# Patient Record
Sex: Female | Born: 1940
Health system: Southern US, Community
[De-identification: ages and names within clinical notes are randomized; demographics above are authoritative.]

## PROBLEM LIST (undated history)

## (undated) DIAGNOSIS — M1990 Unspecified osteoarthritis, unspecified site: Secondary | ICD-10-CM

## (undated) DIAGNOSIS — I10 Essential (primary) hypertension: Secondary | ICD-10-CM

## (undated) HISTORY — PX: APPENDECTOMY: SHX54

## (undated) HISTORY — PX: ABDOMINAL HYSTERECTOMY: SHX81

## (undated) HISTORY — PX: TONSILLECTOMY: SUR1361

---

## 2019-03-31 DIAGNOSIS — M25552 Pain in left hip: Secondary | ICD-10-CM | POA: Diagnosis not present

## 2019-03-31 DIAGNOSIS — M1612 Unilateral primary osteoarthritis, left hip: Secondary | ICD-10-CM | POA: Diagnosis not present

## 2019-04-04 DIAGNOSIS — Z01118 Encounter for examination of ears and hearing with other abnormal findings: Secondary | ICD-10-CM | POA: Diagnosis not present

## 2019-04-04 DIAGNOSIS — Z1329 Encounter for screening for other suspected endocrine disorder: Secondary | ICD-10-CM | POA: Diagnosis not present

## 2019-04-04 DIAGNOSIS — Z87891 Personal history of nicotine dependence: Secondary | ICD-10-CM | POA: Diagnosis not present

## 2019-04-04 DIAGNOSIS — E1165 Type 2 diabetes mellitus with hyperglycemia: Secondary | ICD-10-CM | POA: Diagnosis not present

## 2019-04-04 DIAGNOSIS — I10 Essential (primary) hypertension: Secondary | ICD-10-CM | POA: Diagnosis not present

## 2019-04-04 DIAGNOSIS — Z01811 Encounter for preprocedural respiratory examination: Secondary | ICD-10-CM | POA: Diagnosis not present

## 2019-04-04 DIAGNOSIS — E559 Vitamin D deficiency, unspecified: Secondary | ICD-10-CM | POA: Diagnosis not present

## 2019-04-04 DIAGNOSIS — Z136 Encounter for screening for cardiovascular disorders: Secondary | ICD-10-CM | POA: Diagnosis not present

## 2019-04-04 DIAGNOSIS — Z0001 Encounter for general adult medical examination with abnormal findings: Secondary | ICD-10-CM | POA: Diagnosis not present

## 2019-04-04 DIAGNOSIS — E782 Mixed hyperlipidemia: Secondary | ICD-10-CM | POA: Diagnosis not present

## 2019-04-23 NOTE — Progress Notes (Signed)
Need surgery orders for 6-18 surgery pre op is 6-15

## 2019-04-28 DIAGNOSIS — Z1231 Encounter for screening mammogram for malignant neoplasm of breast: Secondary | ICD-10-CM | POA: Diagnosis not present

## 2019-04-30 NOTE — H&P (Signed)
TOTAL HIP ADMISSION H&P  Patient is admitted for left total hip arthroplasty, anterior approach.  Subjective:  Chief Complaint:   Left hip primary OA / pain  HPI: Danielle Tate, 78 y.o. female, has a history of pain and functional disability in the left hip(s) due to arthritis and patient has failed non-surgical conservative treatments for greater than 12 weeks to include NSAID's and/or analgesics, corticosteriod injections, use of assistive devices and activity modification.  Onset of symptoms was gradual starting < 1 year ago with rapidlly worsening course since that time.The patient noted no past surgery on the left hip(s).  Patient currently rates pain in the left hip at 10 out of 10 with activity. Patient has worsening of pain with activity and weight bearing, trendelenberg gait, pain that interfers with activities of daily living and pain with passive range of motion. Patient has evidence of periarticular osteophytes and joint space narrowing by imaging studies. This condition presents safety issues increasing the risk of falls. There is no current active infection.  Risks, benefits and expectations were discussed with the patient.  Risks including but not limited to the risk of anesthesia, blood clots, nerve damage, blood vessel damage, failure of the prosthesis, infection and up to and including death.  Patient understand the risks, benefits and expectations and wishes to proceed with surgery.   PCP: Patient, No Pcp Per  D/C Plans:       Home   Post-op Meds:       No Rx given   Tranexamic Acid:      To be given - IV   Decadron:      Is to be given  FYI:      ASA  Norco  DME:    Rx given for - RW &  3-n-1  PT:   HEP  Pharmacy: [email or web address]     Past Surgical History:  Procedure Laterality Date  . ABDOMINAL HYSTERECTOMY    . APPENDECTOMY    . TONSILLECTOMY      No current facility-administered medications for this encounter.    Current Outpatient Medications   Medication Sig Dispense Refill Last Dose  . amLODipine-benazepril (LOTREL) 5-20 MG capsule Take 1 capsule by mouth daily.     Marland Kitchen aspirin EC 81 MG tablet Take 81 mg by mouth daily.     Marland Kitchen atorvastatin (LIPITOR) 40 MG tablet Take 40 mg by mouth daily.     . cetirizine (ZYRTEC) 10 MG tablet Take 10 mg by mouth daily as needed for allergies.     . hydrochlorothiazide (HYDRODIURIL) 25 MG tablet Take 25 mg by mouth daily.     . meloxicam (MOBIC) 7.5 MG tablet Take 7.5 mg by mouth daily as needed for pain.     Marland Kitchen omeprazole (PRILOSEC) 40 MG capsule Take 40 mg by mouth daily.      No Known Allergies   Social History   Tobacco Use  . Smoking status: Former Smoker    Quit date: 2000    Years since quitting: 20.4  . Smokeless tobacco: Never Used  Substance Use Topics  . Alcohol use: Not Currently       Review of Systems  Constitutional: Negative.   HENT: Negative.   Eyes: Negative.   Respiratory: Negative.   Cardiovascular: Negative.   Gastrointestinal: Positive for heartburn.  Genitourinary: Positive for frequency.  Musculoskeletal: Positive for joint pain.  Skin: Negative.   Neurological: Negative.   Endo/Heme/Allergies: Positive for environmental allergies.  Psychiatric/Behavioral: Negative.  Objective:  Physical Exam  Constitutional: She is oriented to person, place, and time. She appears well-developed.  HENT:  Head: Normocephalic.  Eyes: Pupils are equal, round, and reactive to light.  Neck: Neck supple. No JVD present. No tracheal deviation present. No thyromegaly present.  Cardiovascular: Normal rate, regular rhythm and intact distal pulses.  Respiratory: Effort normal and breath sounds normal. No respiratory distress. She has no wheezes.  GI: Soft. There is no abdominal tenderness. There is no guarding.  Musculoskeletal:     Left hip: She exhibits decreased range of motion, decreased strength, tenderness and bony tenderness. She exhibits no swelling, no deformity and  no laceration.  Lymphadenopathy:    She has no cervical adenopathy.  Neurological: She is alert and oriented to person, place, and time.  Skin: Skin is warm and dry.  Psychiatric: She has a normal mood and affect.      Imaging Review Plain radiographs demonstrate severe degenerative joint disease of the left hip. The bone quality appears to be good for age and reported activity level.      Assessment/Plan:  End stage arthritis, left hip  The patient history, physical examination, clinical judgement of the provider and imaging studies are consistent with end stage degenerative joint disease of the left hip and total hip arthroplasty is deemed medically necessary. The treatment options including medical management, injection therapy, arthroscopy and arthroplasty were discussed at length. The risks and benefits of total hip arthroplasty were presented and reviewed. The risks due to aseptic loosening, infection, stiffness, dislocation/subluxation,  thromboembolic complications and other imponderables were discussed.  The patient acknowledged the explanation, agreed to proceed with the plan and consent was signed. Patient is being admitted for inpatient treatment for surgery, pain control, PT, OT, prophylactic antibiotics, VTE prophylaxis, progressive ambulation and ADL's and discharge planning.The patient is planning to be discharged home.     [place] Basim Bartnik   PA-C  05/07/2019, 8:46 PM

## 2019-05-02 ENCOUNTER — Other Ambulatory Visit (HOSPITAL_COMMUNITY): Payer: Self-pay | Admitting: *Deleted

## 2019-05-02 NOTE — Patient Instructions (Addendum)
Danielle Tate     Your procedure is scheduled on: 05-08-2019  Report to [facility] Main  Entrance  Report to admitting at [place] [facility] TEST ON_6-15-2020 at 245 pm, THIS TEST MUST BE DONE BEFORE SURGERY, COME TO [place]. ONCE YOUR COVID TEST IS COMPLETED, PLEASE BEGIN THE QUARANTINE INSTRUCTIONS AS OUTLINED IN YOUR HANDOUT.   Call this number if you have problems the morning of surgery [phone]    Remember: Do not eat food or drink liquids :After Midnight. BRUSH YOUR TEETH MORNING OF SURGERY AND RINSE YOUR MOUTH OUT, NO CHEWING GUM CANDY OR MINTS.   NO SOLID FOOD AFTER MIDNIGHT THE NIGHT PRIOR TO SURGERY. NOTHING BY MOUTH EXCEPT CLEAR LIQUIDS UNTIL. PLEASE FINISH ENSURE DRINK PER SURGEON ORDER 3 HOURS PRIOR TO SCHEDULED SURGERY TIME WHICH NEEDS TO BE COMPLETED AT 430 am.    CLEAR LIQUID DIET   Foods Allowed                                                                     Foods Excluded  Coffee and tea, regular and decaf                             liquids that you cannot  Plain Jell-O in any flavor                                             see through such as: Fruit ices (not with fruit pulp)                                     milk, soups, orange juice  Iced Popsicles                                    All solid food Carbonated beverages, regular and diet                                    Cranberry, grape and apple juices Sports drinks like Gatorade Lightly seasoned clear broth or consume(fat free) Sugar, honey syrup  Sample Menu Breakfast                                Lunch                                     Supper Cranberry juice                    Beef broth  Chicken broth Jell-O                                     Grape juice                           Apple juice Coffee or tea                        Jell-O                                       Popsicle                                                Coffee or tea                        Coffee or tea  _____________________________________________________________________     Take these medicines the morning of surgery with A SIP OF WATER: atorvastatin (lipitor), omeprazole (prilosec)                               You may not have any metal on your body including hair pins and              piercings  Do not wear jewelry, make-up, lotions, powders or perfumes, deodorant             Do not wear nail polish.  Do not shave  48 hours prior to surgery.                Do not bring valuables to the hospital. [place].  Contacts, dentures or bridgework may not be worn into surgery.      : _____________________________________________________________________             [facility] - Preparing for Surgery Before surgery, you can play an important role.  Because skin is not sterile, your skin needs to be as free of germs as possible.  You can reduce the number of germs on your skin by washing with CHG (chlorahexidine gluconate) soap before surgery.  CHG is an antiseptic cleaner which kills germs and bonds with the skin to continue killing germs even after washing. Please DO NOT use if you have an allergy to CHG or antibacterial soaps.  If your skin becomes reddened/irritated stop using the CHG and inform your nurse when you arrive at Short Stay. Do not shave (including legs and underarms) for at least 48 hours prior to the first CHG shower.  You may shave your face/neck. Please follow these instructions carefully:  1.  Shower with CHG Soap the night before surgery and the  morning of Surgery.  2.  If you choose to wash your hair, wash your hair first as usual with your  normal  shampoo.  3.  After you shampoo, rinse your hair and body thoroughly to remove the  shampoo.  4.  Use CHG as you would any  other liquid soap.  You can apply chg directly  to the skin and wash                       Gently with a scrungie or clean washcloth.  5.  Apply the CHG Soap to your body ONLY FROM THE NECK DOWN.   Do not use on face/ open                           Wound or open sores. Avoid contact with eyes, ears mouth and genitals (private parts).                       Wash face,  Genitals (private parts) with your normal soap.             6.  Wash thoroughly, paying special attention to the area where your surgery  will be performed.  7.  Thoroughly rinse your body with warm water from the neck down.  8.  DO NOT shower/wash with your normal soap after using and rinsing off  the CHG Soap.                9.  Pat yourself dry with a clean towel.            10.  Wear clean pajamas.            11.  Place clean sheets on your bed the night of your first shower and do not  sleep with pets. Day of Surgery : Do not apply any lotions/deodorants the morning of surgery.  Please wear clean clothes to the hospital/surgery center.  FAILURE TO FOLLOW THESE INSTRUCTIONS MAY RESULT IN THE CANCELLATION OF YOUR SURGERY PATIENT SIGNATURE_________________________________  NURSE SIGNATURE__________________________________  ________________________________________________________________________   Danielle Tate  An incentive spirometer is a tool that can help keep your lungs clear and active. This tool measures how well you are filling your lungs with each breath. Taking long deep breaths may help reverse or decrease the chance of developing breathing (pulmonary) problems (especially infection) following:  A long period of time when you are unable to move or be active. BEFORE THE PROCEDURE   If the spirometer includes an indicator to show your best effort, your nurse or respiratory therapist will set it to a desired goal.  If possible, sit up straight or lean slightly forward. Try not to slouch.  Hold the  incentive spirometer in an upright position. INSTRUCTIONS FOR USE  1. Sit on the edge of your bed if possible, or sit up as far as you can in bed or on a chair. 2. Hold the incentive spirometer in an upright position. 3. Breathe out normally. 4. Place the mouthpiece in your mouth and seal your lips tightly around it. 5. Breathe in slowly and as deeply as possible, raising the piston or the ball toward the top of the column. 6. Hold your breath for 3-5 seconds or for as long as possible. Allow the piston or ball to fall to the bottom of the column. 7. Remove the mouthpiece from your mouth and breathe out normally. 8. Rest for a few seconds and repeat Steps 1 through 7 at least 10 times every 1-2 hours when you are awake. Take your time and take a few normal breaths between deep breaths. 9. The spirometer may include an indicator to  show your best effort. Use the indicator as a goal to work toward during each repetition. 10. After each set of 10 deep breaths, practice coughing to be sure your lungs are clear. If you have an incision (the cut made at the time of surgery), support your incision when coughing by placing a pillow or rolled up towels firmly against it. Once you are able to get out of bed, walk around indoors and cough well. You may stop using the incentive spirometer when instructed by your caregiver.  RISKS AND COMPLICATIONS  Take your time so you do not get dizzy or light-headed.  If you are in pain, you may need to take or ask for pain medication before doing incentive spirometry. It is harder to take a deep breath if you are having pain. AFTER USE  Rest and breathe slowly and easily.  It can be helpful to keep track of a log of your progress. Your caregiver can provide you with a simple table to help with this. If you are using the spirometer at home, follow these instructions: [place] IF:   You are having difficultly using the spirometer.  You have trouble using  the spirometer as often as instructed.  Your pain medication is not giving enough relief while using the spirometer.  You develop fever of 100.5 F (38.1 C) or higher. SEEK IMMEDIATE MEDICAL CARE IF:   You cough up bloody sputum that had not been present before.  You develop fever of 102 F (38.9 C) or greater.  You develop worsening pain at or near the incision site. MAKE SURE YOU:   Understand these instructions.  Will watch your condition.  Will get help right away if you are not doing well or get worse. Document Released: 03/19/2007 Document Revised: 01/29/2012 Document Reviewed: 05/20/2007 Exit[employer] Patient Information 2014 Exit[employer], [place].   ________________________________________________________________________  WHAT IS A BLOOD TRANSFUSION? Blood Transfusion Information  A transfusion is the replacement of blood or some of its parts. Blood is made up of multiple cells which provide different functions.  Red blood cells carry oxygen and are used for blood loss replacement.  White blood cells fight against infection.  Platelets control bleeding.  Plasma helps clot blood.  Other blood products are available for specialized needs, such as hemophilia or other clotting disorders. BEFORE THE TRANSFUSION  Who gives blood for transfusions?   Healthy volunteers who are fully evaluated to make sure their blood is safe. This is blood bank blood. Transfusion therapy is the safest it has ever been in the practice of medicine. Before blood is taken from a donor, a complete history is taken to make sure that person has no history of diseases nor engages in risky social behavior (examples are intravenous drug use or sexual activity with multiple partners). The donor's travel history is screened to minimize risk of transmitting infections, such as malaria. The donated blood is tested for signs of infectious diseases, such as HIV and hepatitis. The blood is then tested to be sure it  is compatible with you in order to minimize the chance of a transfusion reaction. If you or a relative donates blood, this is often done in anticipation of surgery and is not appropriate for emergency situations. It takes many days to process the donated blood. RISKS AND COMPLICATIONS Although transfusion therapy is very safe and saves many lives, the main dangers of transfusion include:   Getting an infectious disease.  Developing a transfusion reaction. This is an allergic reaction  to something in the blood you were given. Every precaution is taken to prevent this. The decision to have a blood transfusion has been considered carefully by your caregiver before blood is given. Blood is not given unless the benefits outweigh the risks. AFTER THE TRANSFUSION  Right after receiving a blood transfusion, you will usually feel much better and more energetic. This is especially true if your red blood cells have gotten low (anemic). The transfusion raises the level of the red blood cells which carry oxygen, and this usually causes an energy increase.  The nurse administering the transfusion will monitor you carefully for complications. HOME CARE INSTRUCTIONS  No special instructions are needed after a transfusion. You may find your energy is better. Speak with your caregiver about any limitations on activity for underlying diseases you may have. SEEK MEDICAL CARE IF:   Your condition is not improving after your transfusion.  You develop redness or irritation at the intravenous (IV) site. SEEK IMMEDIATE MEDICAL CARE IF:  Any of the following symptoms occur over the next 12 hours:  Shaking chills.  You have a temperature by mouth above 102 F (38.9 C), not controlled by medicine.  Chest, back, or muscle pain.  People around you feel you are not acting correctly or are confused.  Shortness of breath or difficulty breathing.  Dizziness and fainting.  You get a rash or develop hives.  You  have a decrease in urine output.  Your urine turns a dark color or changes to pink, red, or brown. Any of the following symptoms occur over the next 10 days:  You have a temperature by mouth above 102 F (38.9 C), not controlled by medicine.  Shortness of breath.  Weakness after normal activity.  The white part of the eye turns yellow (jaundice).  You have a decrease in the amount of urine or are urinating less often.  Your urine turns a dark color or changes to pink, red, or brown. Document Released: 11/03/2000 Document Revised: 01/29/2012 Document Reviewed: 06/22/2008 [facility] Patient Information 2014 [place], [place].  _______________________________________________________________________

## 2019-05-05 ENCOUNTER — Other Ambulatory Visit: Payer: Self-pay

## 2019-05-05 ENCOUNTER — Other Ambulatory Visit (HOSPITAL_COMMUNITY)
Admission: RE | Admit: 2019-05-05 | Discharge: 2019-05-05 | Disposition: A | Discharge status: Home / Self Care | Payer: Medicare HMO | Source: Ambulatory Visit | Attending: Orthopedic Surgery | Admitting: Orthopedic Surgery

## 2019-05-05 ENCOUNTER — Encounter (HOSPITAL_COMMUNITY): Payer: Self-pay

## 2019-05-05 ENCOUNTER — Encounter (HOSPITAL_COMMUNITY)
Admission: RE | Admit: 2019-05-05 | Discharge: 2019-05-05 | Disposition: A | Discharge status: Home / Self Care | Payer: Medicare HMO | Source: Ambulatory Visit | Attending: Orthopedic Surgery | Admitting: Orthopedic Surgery

## 2019-05-05 DIAGNOSIS — Z6831 Body mass index (BMI) 31.0-31.9, adult: Secondary | ICD-10-CM | POA: Diagnosis not present

## 2019-05-05 DIAGNOSIS — I10 Essential (primary) hypertension: Secondary | ICD-10-CM | POA: Insufficient documentation

## 2019-05-05 DIAGNOSIS — Z90710 Acquired absence of both cervix and uterus: Secondary | ICD-10-CM | POA: Diagnosis not present

## 2019-05-05 DIAGNOSIS — Z87891 Personal history of nicotine dependence: Secondary | ICD-10-CM | POA: Diagnosis not present

## 2019-05-05 DIAGNOSIS — Z791 Long term (current) use of non-steroidal anti-inflammatories (NSAID): Secondary | ICD-10-CM | POA: Diagnosis not present

## 2019-05-05 DIAGNOSIS — E669 Obesity, unspecified: Secondary | ICD-10-CM | POA: Diagnosis not present

## 2019-05-05 DIAGNOSIS — Z880 Allergy status to penicillin: Secondary | ICD-10-CM | POA: Diagnosis not present

## 2019-05-05 DIAGNOSIS — Z7982 Long term (current) use of aspirin: Secondary | ICD-10-CM | POA: Diagnosis not present

## 2019-05-05 DIAGNOSIS — Z79899 Other long term (current) drug therapy: Secondary | ICD-10-CM | POA: Diagnosis not present

## 2019-05-05 DIAGNOSIS — Z01818 Encounter for other preprocedural examination: Secondary | ICD-10-CM | POA: Insufficient documentation

## 2019-05-05 DIAGNOSIS — M1612 Unilateral primary osteoarthritis, left hip: Secondary | ICD-10-CM | POA: Insufficient documentation

## 2019-05-05 DIAGNOSIS — K219 Gastro-esophageal reflux disease without esophagitis: Secondary | ICD-10-CM | POA: Diagnosis not present

## 2019-05-05 DIAGNOSIS — Z1159 Encounter for screening for other viral diseases: Secondary | ICD-10-CM | POA: Insufficient documentation

## 2019-05-05 LAB — CBC
HCT: 39.6 % (ref 36.0–46.0)
Hemoglobin: 12.1 g/dL (ref 12.0–15.0)
MCH: 26.4 pg (ref 26.0–34.0)
MCHC: 30.6 g/dL (ref 30.0–36.0)
MCV: 86.3 fL (ref 80.0–100.0)
Platelets: 239 10*3/uL (ref 150–400)
RBC: 4.59 MIL/uL (ref 3.87–5.11)
RDW: 14.6 % (ref 11.5–15.5)
WBC: 7.8 10*3/uL (ref 4.0–10.5)
nRBC: 0 % (ref 0.0–0.2)

## 2019-05-05 LAB — BASIC METABOLIC PANEL
Anion gap: 8 (ref 5–15)
BUN: 23 mg/dL (ref 8–23)
CO2: 28 mmol/L (ref 22–32)
Calcium: 9 mg/dL (ref 8.9–10.3)
Chloride: 101 mmol/L (ref 98–111)
Creatinine, Ser: 0.85 mg/dL (ref 0.44–1.00)
GFR calc Af Amer: >60 mL/min (ref >60)
GFR calc non Af Amer: >60 mL/min (ref >60)
Glucose, Bld: 87 mg/dL (ref 70–99)
Potassium: 3 mmol/L — ABNORMAL LOW (ref 3.5–5.1)
Sodium: 137 mmol/L (ref 135–145)

## 2019-05-05 LAB — SURGICAL PCR SCREEN
MRSA, PCR: NEGATIVE
Staphylococcus aureus: POSITIVE — AB

## 2019-05-05 NOTE — Progress Notes (Signed)
Konrad Felix PA  Pt stopped taking her ASA and her Mobic 05/01/19 per Dr. Aurea Graff office EKG and Labs are in [place]

## 2019-05-06 LAB — ABO/RH: ABO/RH(D): AB POS

## 2019-05-07 LAB — NOVEL CORONAVIRUS, NAA (HOSP ORDER, SEND-OUT TO REF LAB; TAT 18-24 HRS): SARS-CoV-2, NAA: NOT DETECTED

## 2019-05-07 NOTE — Progress Notes (Signed)
SPOKE W/  patient     SCREENING SYMPTOMS OF COVID 19:   COUGH--NO  RUNNY NOSE--- NO  SORE THROAT---NO  NASAL CONGESTION----NO  SNEEZING----NO  SHORTNESS OF BREATH---NO  DIFFICULTY BREATHING---NO  TEMP >100.0 -----NO  UNEXPLAINED BODY ACHES------NO  CHILLS -------- NO  HEADACHES ---------NO  LOSS OF SMELL/ TASTE --------NO    HAVE YOU OR ANY FAMILY MEMBER TRAVELLED PAST 14 DAYS OUT OF THE   COUNTY---NO STATE----NO COUNTRY----NO  HAVE YOU OR ANY FAMILY MEMBER BEEN EXPOSED TO ANYONE WITH COVID 19? NO

## 2019-05-08 ENCOUNTER — Observation Stay (HOSPITAL_COMMUNITY)
Admission: RE | Admit: 2019-05-08 | Discharge: 2019-05-09 | Disposition: A | Discharge status: Home / Self Care | Payer: Medicare HMO | Attending: Orthopedic Surgery | Admitting: Orthopedic Surgery

## 2019-05-08 ENCOUNTER — Ambulatory Visit (HOSPITAL_COMMUNITY): Payer: Medicare HMO

## 2019-05-08 ENCOUNTER — Encounter (HOSPITAL_COMMUNITY)
Admission: RE | Disposition: A | Discharge status: Home / Self Care | Payer: Self-pay | Source: Home / Self Care | Attending: Orthopedic Surgery

## 2019-05-08 ENCOUNTER — Encounter (HOSPITAL_COMMUNITY): Payer: Self-pay | Admitting: Emergency Medicine

## 2019-05-08 ENCOUNTER — Other Ambulatory Visit: Payer: Self-pay

## 2019-05-08 ENCOUNTER — Ambulatory Visit (HOSPITAL_COMMUNITY): Payer: Medicare HMO | Admitting: Physician Assistant

## 2019-05-08 ENCOUNTER — Ambulatory Visit (HOSPITAL_COMMUNITY): Payer: Medicare HMO | Admitting: Anesthesiology

## 2019-05-08 DIAGNOSIS — M25559 Pain in unspecified hip: Secondary | ICD-10-CM

## 2019-05-08 DIAGNOSIS — Z471 Aftercare following joint replacement surgery: Secondary | ICD-10-CM | POA: Diagnosis not present

## 2019-05-08 DIAGNOSIS — Z87891 Personal history of nicotine dependence: Secondary | ICD-10-CM | POA: Insufficient documentation

## 2019-05-08 DIAGNOSIS — E669 Obesity, unspecified: Secondary | ICD-10-CM | POA: Insufficient documentation

## 2019-05-08 DIAGNOSIS — Z96642 Presence of left artificial hip joint: Secondary | ICD-10-CM | POA: Diagnosis not present

## 2019-05-08 DIAGNOSIS — Z90710 Acquired absence of both cervix and uterus: Secondary | ICD-10-CM | POA: Insufficient documentation

## 2019-05-08 DIAGNOSIS — Z96649 Presence of unspecified artificial hip joint: Secondary | ICD-10-CM

## 2019-05-08 DIAGNOSIS — Z79899 Other long term (current) drug therapy: Secondary | ICD-10-CM | POA: Diagnosis not present

## 2019-05-08 DIAGNOSIS — Z880 Allergy status to penicillin: Secondary | ICD-10-CM | POA: Insufficient documentation

## 2019-05-08 DIAGNOSIS — Z1159 Encounter for screening for other viral diseases: Secondary | ICD-10-CM | POA: Insufficient documentation

## 2019-05-08 DIAGNOSIS — Z7982 Long term (current) use of aspirin: Secondary | ICD-10-CM | POA: Insufficient documentation

## 2019-05-08 DIAGNOSIS — Z791 Long term (current) use of non-steroidal anti-inflammatories (NSAID): Secondary | ICD-10-CM | POA: Diagnosis not present

## 2019-05-08 DIAGNOSIS — Z6831 Body mass index (BMI) 31.0-31.9, adult: Secondary | ICD-10-CM | POA: Insufficient documentation

## 2019-05-08 DIAGNOSIS — K219 Gastro-esophageal reflux disease without esophagitis: Secondary | ICD-10-CM | POA: Insufficient documentation

## 2019-05-08 DIAGNOSIS — M1612 Unilateral primary osteoarthritis, left hip: Secondary | ICD-10-CM | POA: Diagnosis not present

## 2019-05-08 DIAGNOSIS — I10 Essential (primary) hypertension: Secondary | ICD-10-CM | POA: Diagnosis not present

## 2019-05-08 HISTORY — PX: TOTAL HIP ARTHROPLASTY: SHX124

## 2019-05-08 HISTORY — DX: Essential (primary) hypertension: I10

## 2019-05-08 LAB — TYPE AND SCREEN
ABO/RH(D): AB POS
Antibody Screen: NEGATIVE

## 2019-05-08 LAB — BASIC METABOLIC PANEL
Anion gap: 13 (ref 5–15)
BUN: 27 mg/dL — ABNORMAL HIGH (ref 8–23)
CO2: 26 mmol/L (ref 22–32)
Calcium: 9.4 mg/dL (ref 8.9–10.3)
Chloride: 105 mmol/L (ref 98–111)
Creatinine, Ser: 0.82 mg/dL (ref 0.44–1.00)
GFR calc Af Amer: >60 mL/min (ref >60)
GFR calc non Af Amer: >60 mL/min (ref >60)
Glucose, Bld: 96 mg/dL (ref 70–99)
Potassium: 3.2 mmol/L — ABNORMAL LOW (ref 3.5–5.1)
Sodium: 144 mmol/L (ref 135–145)

## 2019-05-08 SURGERY — ARTHROPLASTY, HIP, TOTAL, ANTERIOR APPROACH
Anesthesia: Spinal | Site: Hip | Laterality: Left

## 2019-05-08 MED ORDER — POLYETHYLENE GLYCOL 3350 17 G PO PACK
17.0000 g | PACK | Freq: Two times a day (BID) | ORAL | Status: DC
  Administered 2019-05-08 – 2019-05-09 (×2): 17 g via ORAL
  Filled 2019-05-08 (×2): qty 1

## 2019-05-08 MED ORDER — METOCLOPRAMIDE HCL 5 MG PO TABS: 5.0000 mg | ORAL_TABLET | Freq: Three times a day (TID) | ORAL | Status: DC | PRN

## 2019-05-08 MED ORDER — AMLODIPINE BESYLATE 5 MG PO TABS
5.0000 mg | ORAL_TABLET | Freq: Once | ORAL | Status: AC
  Administered 2019-05-08: 5 mg via ORAL
  Filled 2019-05-08 (×3): qty 1

## 2019-05-08 MED ORDER — MAGNESIUM CITRATE PO SOLN: 1.0000 | Freq: Once | ORAL | Status: DC | PRN

## 2019-05-08 MED ORDER — FENTANYL CITRATE (PF) 100 MCG/2ML IJ SOLN: 25.0000 ug | INTRAMUSCULAR | Status: DC | PRN

## 2019-05-08 MED ORDER — DEXAMETHASONE SODIUM PHOSPHATE 10 MG/ML IJ SOLN
10.0000 mg | Freq: Once | INTRAMUSCULAR | Status: AC
  Administered 2019-05-08: 10 mg via INTRAVENOUS

## 2019-05-08 MED ORDER — HYDROCHLOROTHIAZIDE 25 MG PO TABS: 25.0000 mg | ORAL_TABLET | Freq: Every day | ORAL | Status: DC

## 2019-05-08 MED ORDER — ONDANSETRON HCL 4 MG PO TABS: 4.0000 mg | ORAL_TABLET | Freq: Four times a day (QID) | ORAL | Status: DC | PRN

## 2019-05-08 MED ORDER — CHLORHEXIDINE GLUCONATE 4 % EX LIQD: 60.0000 mL | Freq: Once | CUTANEOUS | Status: DC

## 2019-05-08 MED ORDER — DIPHENHYDRAMINE HCL 12.5 MG/5ML PO ELIX: 12.5000 mg | ORAL_SOLUTION | ORAL | Status: DC | PRN

## 2019-05-08 MED ORDER — PROPOFOL 500 MG/50ML IV EMUL
INTRAVENOUS | Status: DC | PRN
  Administered 2019-05-08: 50 ug/kg/min via INTRAVENOUS

## 2019-05-08 MED ORDER — ALUM & MAG HYDROXIDE-SIMETH 200-200-20 MG/5ML PO SUSP: 15.0000 mL | ORAL | Status: DC | PRN

## 2019-05-08 MED ORDER — MORPHINE SULFATE (PF) 2 MG/ML IV SOLN: 0.5000 mg | INTRAVENOUS | Status: DC | PRN

## 2019-05-08 MED ORDER — CEFAZOLIN SODIUM-DEXTROSE 2-4 GM/100ML-% IV SOLN
2.0000 g | Freq: Four times a day (QID) | INTRAVENOUS | Status: AC
  Administered 2019-05-08 (×2): 2 g via INTRAVENOUS
  Filled 2019-05-08 (×2): qty 100

## 2019-05-08 MED ORDER — DEXAMETHASONE SODIUM PHOSPHATE 10 MG/ML IJ SOLN
INTRAMUSCULAR | Status: AC
  Filled 2019-05-08: qty 1

## 2019-05-08 MED ORDER — PANTOPRAZOLE SODIUM 40 MG PO TBEC
80.0000 mg | DELAYED_RELEASE_TABLET | Freq: Every day | ORAL | Status: DC
  Administered 2019-05-08 – 2019-05-09 (×2): 80 mg via ORAL
  Filled 2019-05-08 (×3): qty 2

## 2019-05-08 MED ORDER — ACETAMINOPHEN 325 MG PO TABS: 325.0000 mg | ORAL_TABLET | Freq: Four times a day (QID) | ORAL | Status: DC | PRN

## 2019-05-08 MED ORDER — METOCLOPRAMIDE HCL 5 MG/ML IJ SOLN: 5.0000 mg | Freq: Three times a day (TID) | INTRAMUSCULAR | Status: DC | PRN

## 2019-05-08 MED ORDER — MENTHOL 3 MG MT LOZG: 1.0000 | LOZENGE | OROMUCOSAL | Status: DC | PRN

## 2019-05-08 MED ORDER — ONDANSETRON HCL 4 MG/2ML IJ SOLN
INTRAMUSCULAR | Status: AC
  Filled 2019-05-08: qty 2

## 2019-05-08 MED ORDER — METHOCARBAMOL 500 MG PO TABS
500.0000 mg | ORAL_TABLET | Freq: Four times a day (QID) | ORAL | Status: DC | PRN
  Administered 2019-05-08: 500 mg via ORAL
  Filled 2019-05-08: qty 1

## 2019-05-08 MED ORDER — ONDANSETRON HCL 4 MG/2ML IJ SOLN: 4.0000 mg | Freq: Once | INTRAMUSCULAR | Status: DC | PRN

## 2019-05-08 MED ORDER — PROPOFOL 10 MG/ML IV BOLUS
INTRAVENOUS | Status: AC
  Filled 2019-05-08: qty 40

## 2019-05-08 MED ORDER — PHENOL 1.4 % MT LIQD: 1.0000 | OROMUCOSAL | Status: DC | PRN

## 2019-05-08 MED ORDER — PROPOFOL 500 MG/50ML IV EMUL
INTRAVENOUS | Status: DC | PRN
  Administered 2019-05-08: 20 mg via INTRAVENOUS
  Administered 2019-05-08 (×2): 10 mg via INTRAVENOUS

## 2019-05-08 MED ORDER — FERROUS SULFATE 325 (65 FE) MG PO TABS
325.0000 mg | ORAL_TABLET | Freq: Three times a day (TID) | ORAL | Status: DC
  Administered 2019-05-09 (×2): 325 mg via ORAL
  Filled 2019-05-08 (×2): qty 1

## 2019-05-08 MED ORDER — 0.9 % SODIUM CHLORIDE (POUR BTL) OPTIME
TOPICAL | Status: DC | PRN
  Administered 2019-05-08: 12:00:00 1000 mL

## 2019-05-08 MED ORDER — ATORVASTATIN CALCIUM 40 MG PO TABS
40.0000 mg | ORAL_TABLET | Freq: Every day | ORAL | Status: DC
  Administered 2019-05-08: 40 mg via ORAL
  Filled 2019-05-08: qty 1

## 2019-05-08 MED ORDER — BISACODYL 10 MG RE SUPP: 10.0000 mg | Freq: Every day | RECTAL | Status: DC | PRN

## 2019-05-08 MED ORDER — STERILE WATER FOR IRRIGATION IR SOLN
Status: DC | PRN
  Administered 2019-05-08: 2000 mL

## 2019-05-08 MED ORDER — LACTATED RINGERS IV SOLN
INTRAVENOUS | Status: DC
  Administered 2019-05-08 (×2): via INTRAVENOUS

## 2019-05-08 MED ORDER — CEFAZOLIN SODIUM-DEXTROSE 2-4 GM/100ML-% IV SOLN
2.0000 g | INTRAVENOUS | Status: AC
  Administered 2019-05-08: 11:00:00 2 g via INTRAVENOUS
  Filled 2019-05-08: qty 100

## 2019-05-08 MED ORDER — ASPIRIN 81 MG PO CHEW
81.0000 mg | CHEWABLE_TABLET | Freq: Two times a day (BID) | ORAL | Status: DC
  Administered 2019-05-08 – 2019-05-09 (×2): 81 mg via ORAL
  Filled 2019-05-08 (×2): qty 1

## 2019-05-08 MED ORDER — ONDANSETRON HCL 4 MG/2ML IJ SOLN: 4.0000 mg | Freq: Four times a day (QID) | INTRAMUSCULAR | Status: DC | PRN

## 2019-05-08 MED ORDER — SODIUM CHLORIDE 0.9 % IV SOLN
INTRAVENOUS | Status: DC | PRN
  Administered 2019-05-08: 11:00:00 25 ug/min via INTRAVENOUS

## 2019-05-08 MED ORDER — METHOCARBAMOL 500 MG IVPB - SIMPLE MED
500.0000 mg | Freq: Four times a day (QID) | INTRAVENOUS | Status: DC | PRN
  Filled 2019-05-08: qty 50

## 2019-05-08 MED ORDER — HYDROCHLOROTHIAZIDE 25 MG PO TABS
25.0000 mg | ORAL_TABLET | Freq: Every day | ORAL | Status: DC
  Administered 2019-05-08 – 2019-05-09 (×2): 25 mg via ORAL
  Filled 2019-05-08 (×2): qty 1

## 2019-05-08 MED ORDER — OXYCODONE HCL 5 MG/5ML PO SOLN: 5.0000 mg | Freq: Once | ORAL | Status: DC | PRN

## 2019-05-08 MED ORDER — SODIUM CHLORIDE 0.9 % IV SOLN
INTRAVENOUS | Status: DC
  Administered 2019-05-08 – 2019-05-09 (×2): via INTRAVENOUS

## 2019-05-08 MED ORDER — ONDANSETRON HCL 4 MG/2ML IJ SOLN
INTRAMUSCULAR | Status: DC | PRN
  Administered 2019-05-08: 4 mg via INTRAVENOUS

## 2019-05-08 MED ORDER — LORATADINE 10 MG PO TABS
10.0000 mg | ORAL_TABLET | Freq: Every day | ORAL | Status: DC
  Administered 2019-05-08 – 2019-05-09 (×2): 10 mg via ORAL
  Filled 2019-05-08 (×2): qty 1

## 2019-05-08 MED ORDER — BUPIVACAINE IN DEXTROSE 0.75-8.25 % IT SOLN
INTRATHECAL | Status: DC | PRN
  Administered 2019-05-08: 1.4 mL via INTRATHECAL

## 2019-05-08 MED ORDER — OXYCODONE HCL 5 MG PO TABS: 5.0000 mg | ORAL_TABLET | Freq: Once | ORAL | Status: DC | PRN

## 2019-05-08 MED ORDER — PHENYLEPHRINE HCL (PRESSORS) 10 MG/ML IV SOLN
INTRAVENOUS | Status: AC
  Filled 2019-05-08: qty 1

## 2019-05-08 MED ORDER — HYDROCODONE-ACETAMINOPHEN 5-325 MG PO TABS
1.0000 | ORAL_TABLET | ORAL | Status: DC | PRN
  Administered 2019-05-08 – 2019-05-09 (×4): 1 via ORAL
  Filled 2019-05-08 (×4): qty 1

## 2019-05-08 MED ORDER — HYDROCODONE-ACETAMINOPHEN 7.5-325 MG PO TABS: 1.0000 | ORAL_TABLET | ORAL | Status: DC | PRN

## 2019-05-08 MED ORDER — DOCUSATE SODIUM 100 MG PO CAPS
100.0000 mg | ORAL_CAPSULE | Freq: Two times a day (BID) | ORAL | Status: DC
  Administered 2019-05-08 – 2019-05-09 (×2): 100 mg via ORAL
  Filled 2019-05-08 (×2): qty 1

## 2019-05-08 MED ORDER — TRANEXAMIC ACID-NACL 1000-0.7 MG/100ML-% IV SOLN
1000.0000 mg | INTRAVENOUS | Status: AC
  Administered 2019-05-08: 1000 mg via INTRAVENOUS
  Filled 2019-05-08: qty 100

## 2019-05-08 MED ORDER — DEXAMETHASONE SODIUM PHOSPHATE 10 MG/ML IJ SOLN
10.0000 mg | Freq: Once | INTRAMUSCULAR | Status: AC
  Administered 2019-05-09: 10 mg via INTRAVENOUS
  Filled 2019-05-08: qty 1

## 2019-05-08 MED ORDER — TRANEXAMIC ACID-NACL 1000-0.7 MG/100ML-% IV SOLN
1000.0000 mg | Freq: Once | INTRAVENOUS | Status: AC
  Administered 2019-05-08: 1000 mg via INTRAVENOUS
  Filled 2019-05-08: qty 100

## 2019-05-08 SURGICAL SUPPLY — 46 items
BAG DECANTER FOR FLEXI CONT (MISCELLANEOUS) IMPLANT
BAG ZIPLOCK 12X15 (MISCELLANEOUS) IMPLANT
BLADE SAG 18X100X1.27 (BLADE) ×2 IMPLANT
BLADE SURG SZ10 CARB STEEL (BLADE) ×4 IMPLANT
COVER PERINEAL POST (MISCELLANEOUS) ×2 IMPLANT
COVER SURGICAL LIGHT HANDLE (MISCELLANEOUS) ×2 IMPLANT
COVER WAND RF STERILE (DRAPES) IMPLANT
CUP ACET PINNACLE SECTR 50MM (Hips) ×1 IMPLANT
DERMABOND ADVANCED (GAUZE/BANDAGES/DRESSINGS) ×1
DERMABOND ADVANCED .7 DNX12 (GAUZE/BANDAGES/DRESSINGS) ×1 IMPLANT
DRAPE STERI IOBAN 125X83 (DRAPES) ×2 IMPLANT
DRAPE U-SHAPE 47X51 STRL (DRAPES) ×4 IMPLANT
DRESSING AQUACEL AG SP 3.5X10 (GAUZE/BANDAGES/DRESSINGS) ×1 IMPLANT
DRSG AQUACEL AG ADV 3.5X10 (GAUZE/BANDAGES/DRESSINGS) ×2 IMPLANT
DRSG AQUACEL AG SP 3.5X10 (GAUZE/BANDAGES/DRESSINGS) ×2
DURAPREP 26ML APPLICATOR (WOUND CARE) ×2 IMPLANT
ELECT BLADE TIP CTD 4 INCH (ELECTRODE) ×2 IMPLANT
ELECT REM PT RETURN 15FT ADLT (MISCELLANEOUS) ×2 IMPLANT
ELIMINATOR HOLE APEX DEPUY (Hips) ×2 IMPLANT
GLOVE BIO SURGEON STRL SZ 6 (GLOVE) ×4 IMPLANT
GLOVE BIOGEL PI IND STRL 6.5 (GLOVE) ×1 IMPLANT
GLOVE BIOGEL PI IND STRL 8.5 (GLOVE) ×1 IMPLANT
GLOVE BIOGEL PI INDICATOR 6.5 (GLOVE) ×1
GLOVE BIOGEL PI INDICATOR 8.5 (GLOVE) ×1
GLOVE ECLIPSE 8.0 STRL XLNG CF (GLOVE) ×4 IMPLANT
GLOVE ORTHO TXT STRL SZ7.5 (GLOVE) ×4 IMPLANT
GOWN STRL REUS W/TWL LRG LVL3 (GOWN DISPOSABLE) ×4 IMPLANT
GOWN STRL REUS W/TWL XL LVL3 (GOWN DISPOSABLE) ×2 IMPLANT
HEAD FEM STD 32X+1 STRL (Hips) ×2 IMPLANT
HOLDER FOLEY CATH W/STRAP (MISCELLANEOUS) ×2 IMPLANT
KIT TURNOVER KIT A (KITS) IMPLANT
LINER ACET PNNCL PLUS4 NEUTRAL (Hips) ×1 IMPLANT
PACK ANTERIOR HIP CUSTOM (KITS) ×2 IMPLANT
PINNACLE PLUS 4 NEUTRAL (Hips) ×2 IMPLANT
PINNACLE SECTOR CUP 50MM (Hips) ×2 IMPLANT
SCREW 6.5MMX30MM (Screw) ×2 IMPLANT
STEM FEM ACTIS HIGH SZ2 (Stem) ×2 IMPLANT
SUT MNCRL AB 4-0 PS2 18 (SUTURE) ×2 IMPLANT
SUT STRATAFIX 0 PDS 27 VIOLET (SUTURE) ×2
SUT VIC AB 1 CT1 36 (SUTURE) ×6 IMPLANT
SUT VIC AB 2-0 CT1 27 (SUTURE) ×2
SUT VIC AB 2-0 CT1 TAPERPNT 27 (SUTURE) ×2 IMPLANT
SUTURE STRATFX 0 PDS 27 VIOLET (SUTURE) ×1 IMPLANT
TRAY FOLEY MTR SLVR 16FR STAT (SET/KITS/TRAYS/PACK) IMPLANT
WATER STERILE IRR 1000ML POUR (IV SOLUTION) ×2 IMPLANT
YANKAUER SUCT BULB TIP 10FT TU (MISCELLANEOUS) IMPLANT

## 2019-05-08 NOTE — Discharge Instructions (Signed)
INSTRUCTIONS AFTER JOINT REPLACEMENT  ° °o Remove items at home which could result in a fall. This includes throw rugs or furniture in walking pathways °o ICE to the affected joint every three hours while awake for 30 minutes at a time, for at least the first 3-5 days, and then as needed for pain and swelling.  Continue to use ice for pain and swelling. You may notice swelling that will progress down to the foot and ankle.  This is normal after surgery.  Elevate your leg when you are not up walking on it.   °o Continue to use the breathing machine you got in the hospital (incentive spirometer) which will help keep your temperature down.  It is common for your temperature to cycle up and down following surgery, especially at night when you are not up moving around and exerting yourself.  The breathing machine keeps your lungs expanded and your temperature down. ° ° °DIET:  As you were doing prior to hospitalization, we recommend a well-balanced diet. ° °DRESSING / WOUND CARE / SHOWERING ° °Keep the surgical dressing until follow up.  The dressing is water proof, so you can shower without any extra covering.  IF THE DRESSING FALLS OFF or the wound gets wet inside, change the dressing with sterile gauze.  Please use good hand washing techniques before changing the dressing.  Do not use any lotions or creams on the incision until instructed by your surgeon.   ° °ACTIVITY ° °o Increase activity slowly as tolerated, but follow the weight bearing instructions below.   °o No driving for 6 weeks or until further direction given by your physician.  You cannot drive while taking narcotics.  °o No lifting or carrying greater than 10 lbs. until further directed by your surgeon. °o Avoid periods of inactivity such as sitting longer than an hour when not asleep. This helps prevent blood clots.  °o You may return to work once you are authorized by your doctor.  ° ° ° °WEIGHT BEARING  ° °Weight bearing as tolerated with assist  device (walker, cane, etc) as directed, use it as long as suggested by your surgeon or therapist, typically at least 4-6 weeks. ° ° °EXERCISES ° °Results after joint replacement surgery are often greatly improved when you follow the exercise, range of motion and muscle strengthening exercises prescribed by your doctor. Safety measures are also important to protect the joint from further injury. Any time any of these exercises cause you to have increased pain or swelling, decrease what you are doing until you are comfortable again and then slowly increase them. If you have problems or questions, call your caregiver or physical therapist for advice.  ° °Rehabilitation is important following a joint replacement. After just a few days of immobilization, the muscles of the leg can become weakened and shrink (atrophy).  These exercises are designed to build up the tone and strength of the thigh and leg muscles and to improve motion. Often times heat used for twenty to thirty minutes before working out will loosen up your tissues and help with improving the range of motion but do not use heat for the first two weeks following surgery (sometimes heat can increase post-operative swelling).  ° °These exercises can be done on a training (exercise) mat, on the floor, on a table or on a bed. Use whatever works the best and is most comfortable for you.    Use music or television while you are exercising so that   the exercises are a pleasant break in your day. This will make your life better with the exercises acting as a break in your routine that you can look forward to.   Perform all exercises about fifteen times, three times per day or as directed.  You should exercise both the operative leg and the other leg as well. ° °Exercises include: °  °• Quad Sets - Tighten up the muscle on the front of the thigh (Quad) and hold for 5-10 seconds.   °• Straight Leg Raises - With your knee straight (if you were given a brace, keep it on),  lift the leg to 60 degrees, hold for 3 seconds, and slowly lower the leg.  Perform this exercise against resistance later as your leg gets stronger.  °• Leg Slides: Lying on your back, slowly slide your foot toward your buttocks, bending your knee up off the floor (only go as far as is comfortable). Then slowly slide your foot back down until your leg is flat on the floor again.  °• Angel Wings: Lying on your back spread your legs to the side as far apart as you can without causing discomfort.  °• Hamstring Strength:  Lying on your back, push your heel against the floor with your leg straight by tightening up the muscles of your buttocks.  Repeat, but this time bend your knee to a comfortable angle, and push your heel against the floor.  You may put a pillow under the heel to make it more comfortable if necessary.  ° °A rehabilitation program following joint replacement surgery can speed recovery and prevent re-injury in the future due to weakened muscles. Contact your doctor or a physical therapist for more information on knee rehabilitation.  ° ° °CONSTIPATION ° °Constipation is defined medically as fewer than three stools per week and severe constipation as less than one stool per week.  Even if you have a regular bowel pattern at home, your normal regimen is likely to be disrupted due to multiple reasons following surgery.  Combination of anesthesia, postoperative narcotics, change in appetite and fluid intake all can affect your bowels.  ° °YOU MUST use at least one of the following options; they are listed in order of increasing strength to get the job done.  They are all available over the counter, and you may need to use some, POSSIBLY even all of these options:   ° °Drink plenty of fluids (prune juice may be helpful) and high fiber foods °Colace 100 mg by mouth twice a day  °Senokot for constipation as directed and as needed Dulcolax (bisacodyl), take with full glass of water  °Miralax (polyethylene glycol)  once or twice a day as needed. ° °If you have tried all these things and are unable to have a bowel movement in the first 3-4 days after surgery call either your surgeon or your primary doctor.   ° °If you experience loose stools or diarrhea, hold the medications until you stool forms back up.  If your symptoms do not get better within 1 week or if they get worse, check with your doctor.  If you experience "the worst abdominal pain ever" or develop nausea or vomiting, please contact the office immediately for further recommendations for treatment. ° ° °ITCHING:  If you experience itching with your medications, try taking only a single pain pill, or even half a pain pill at a time.  You can also use Benadryl over the counter for itching or also to   help with sleep.   TED HOSE STOCKINGS:  Use stockings on both legs until for at least 2 weeks or as directed by physician office. They may be removed at night for sleeping.  MEDICATIONS:  See your medication summary on the After Visit Summary that nursing will review with you.  You may have some home medications which will be placed on hold until you complete the course of blood thinner medication.  It is important for you to complete the blood thinner medication as prescribed.  PRECAUTIONS:  If you experience chest pain or shortness of breath - call 911 immediately for transfer to the hospital emergency department.   If you develop a fever greater that 101 F, purulent drainage from wound, increased redness or drainage from wound, foul odor from the wound/dressing, or calf pain - CONTACT YOUR SURGEON.                                                   FOLLOW-UP APPOINTMENTS:  If you do not already have a post-op appointment, please call the office for an appointment to be seen by your surgeon.  Guidelines for how soon to be seen are listed in your After Visit Summary, but are typically between 1-4 weeks after surgery.  OTHER INSTRUCTIONS:     MAKE SURE  YOU:   Understand these instructions.   Get help right away if you are not doing well or get worse.    Thank you for letting us be a part of your medical care team.  It is a privilege we respect greatly.  We hope these instructions will help you stay on track for a fast and full recovery!

## 2019-05-08 NOTE — Progress Notes (Signed)
Danielle Tate, pharmacist communicated that she spoke with Dr. Alvan Dame and we are okay to administer ancef during surgery.

## 2019-05-08 NOTE — Anesthesia Postprocedure Evaluation (Signed)
Anesthesia Post Note  Patient: Danielle Tate  Procedure(s) Performed: TOTAL HIP ARTHROPLASTY ANTERIOR APPROACH (Left Hip)     Patient location during evaluation: PACU Anesthesia Type: Spinal Level of consciousness: awake and alert Pain management: pain level controlled Vital Signs Assessment: post-procedure vital signs reviewed and stable Respiratory status: spontaneous breathing and respiratory function stable Cardiovascular status: blood pressure returned to baseline and stable Postop Assessment: spinal receding and no apparent nausea or vomiting Anesthetic complications: no    Last Vitals:  Vitals:   05/08/19 1330 05/08/19 1345  BP: (!) 142/80 (!) 146/77  Pulse: (!) 58 79  Resp: 13 12  Temp:  (!) 36.3 C  SpO2: 100% 100%    Last Pain:  Vitals:   05/08/19 1345  TempSrc:   PainSc: [place]

## 2019-05-08 NOTE — Anesthesia Preprocedure Evaluation (Addendum)
Anesthesia Evaluation  Patient identified by MRN, date of birth, ID band Patient awake    Reviewed: Allergy & Precautions, NPO status , Patient's Chart, lab work & pertinent test results  History of Anesthesia Complications Negative for: history of anesthetic complications  Airway Mallampati: II  TM Distance: >3 FB Neck ROM: Full    Dental  (+) Dental Advisory Given, Partial Lower, Partial Upper   Pulmonary former smoker,    breath sounds clear to auscultation       Cardiovascular hypertension, Pt. on medications (-) angina Rhythm:Regular Rate:Normal     Neuro/Psych negative neurological ROS  negative psych ROS   GI/Hepatic Neg liver ROS, GERD  Medicated and Controlled,  Endo/Other   Obesity   Renal/GU negative Renal ROS     Musculoskeletal negative musculoskeletal ROS (+)   Abdominal   Peds  Hematology negative hematology ROS (+)   Anesthesia Other Findings Anaphylaxis to penicillin   Reproductive/Obstetrics                            Anesthesia Physical Anesthesia Plan  ASA: II  Anesthesia Plan: Spinal   Post-op Pain Management:    Induction:   PONV Risk Score and Plan: 2 and Treatment may vary due to age or medical condition and Propofol infusion  Airway Management Planned: Natural Airway and Simple Face Mask  Additional Equipment: None  Intra-op Plan:   Post-operative Plan:   Informed Consent: I have reviewed the patients History and Physical, chart, labs and discussed the procedure including the risks, benefits and alternatives for the proposed anesthesia with the patient or authorized representative who has indicated his/her understanding and acceptance.       Plan Discussed with: CRNA and Anesthesiologist  Anesthesia Plan Comments: (Labs reviewed, platelets acceptable. Discussed risks and benefits of spinal, including spinal/epidural hematoma, infection,  failed block, and PDPH. Patient expressed understanding and wished to proceed. )       Anesthesia Quick Evaluation

## 2019-05-08 NOTE — Anesthesia Procedure Notes (Signed)
Spinal  Patient location during procedure: OR Start time: 05/08/2019 10:50 AM End time: 05/08/2019 10:55 AM Staffing Anesthesiologist: Audry Pili, MD Resident/CRNA: Glory Buff, CRNA Performed: resident/CRNA  Preanesthetic Checklist Completed: patient identified, site marked, surgical consent, pre-op evaluation, timeout performed, IV checked, risks and benefits discussed and monitors and equipment checked Spinal Block Patient position: sitting Prep: DuraPrep Patient monitoring: heart rate, cardiac monitor, continuous pulse ox and blood pressure Approach: midline Location: L3-4 Injection technique: single-shot Needle Needle type: Pencan  Needle gauge: 24 G Needle length: 9 cm Assessment Sensory level: T4 Additional Notes Kit date checked and verified.  Sterile prep and drape, skin local with 1% lidocaine, - heme, - paraethesia, +CSF pre and post injection, patient tolerated procedure well.

## 2019-05-08 NOTE — Op Note (Signed)
NAME:  Danielle Tate                ACCOUNT NO.: [id number]      MEDICAL RECORD NO.: [id number]      FACILITY:  [facility]      PHYSICIAN:  Mauri Pole  DATE OF BIRTH:  1941/08/05     DATE OF PROCEDURE:  05/08/2019                                 OPERATIVE REPORT         PREOPERATIVE DIAGNOSIS: Left  hip osteoarthritis.      POSTOPERATIVE DIAGNOSIS:  Left hip osteoarthritis.      PROCEDURE:  Left total hip replacement through an anterior approach   utilizing DePuy THR system, component size 80mm pinnacle cup, a size 32+4 neutral   Altrex liner, a size 2 Hi Actis stem with a 32+1 Articuleze metal ball.      SURGEON:  Pietro Cassis. Alvan Dame, M.D.      ASSISTANT:  Danae Orleans, PA-C     ANESTHESIA:  Spinal.      SPECIMENS:  None.      COMPLICATIONS:  None.      BLOOD LOSS:  400 cc     DRAINS:  None.      INDICATION OF THE PROCEDURE:  Danielle Tate is a 78 y.o. female who had   presented to office for evaluation of left hip pain.  Radiographs revealed   progressive degenerative changes with bone-on-bone   articulation of the  hip joint, including subchondral cystic changes and osteophytes.  The patient had painful limited range of   motion significantly affecting their overall quality of life and function.  The patient was failing to    respond to conservative measures including medications and/or injections and activity modification and at this point was ready   to proceed with more definitive measures.  Consent was obtained for   benefit of pain relief.  Specific risks of infection, DVT, component   failure, dislocation, neurovascular injury, and need for revision surgery were reviewed in the office as well discussion of   the anterior versus posterior approach were reviewed.     PROCEDURE IN DETAIL:  The patient was brought to operative theater.   Once adequate anesthesia, preoperative antibiotics, 2 gm of Ancef, 1 gm of Tranexamic Acid, and 10 mg of  Decadron were administered, the patient was positioned supine on the [employer] table.  Once the patient was safely positioned with adequate padding of boney prominences we predraped out the hip, and used fluoroscopy to confirm orientation of the pelvis.      The left hip was then prepped and draped from proximal iliac crest to   mid thigh with a shower curtain technique.      Time-out was performed identifying the patient, planned procedure, and the appropriate extremity.     An incision was then made 2 cm lateral to the   anterior superior iliac spine extending over the orientation of the   tensor fascia lata muscle and sharp dissection was carried down to the   fascia of the muscle.      The fascia was then incised.  The muscle belly was identified and swept   laterally and retractor placed along the superior neck.  Following   cauterization of the circumflex vessels and removing some pericapsular   fat, a  second cobra retractor was placed on the inferior neck.  A T-capsulotomy was made along the line of the   superior neck to the trochanteric fossa, then extended proximally and   distally.  Tag sutures were placed and the retractors were then placed   intracapsular.  We then identified the trochanteric fossa and   orientation of my neck cut and then made a neck osteotomy with the femur on traction.  The femoral   head was removed without difficulty or complication.  Traction was let   off and retractors were placed posterior and anterior around the   acetabulum.      The labrum and foveal tissue were debrided.  I began reaming with a 45 mm   reamer and reamed up to 49 mm reamer with good bony bed preparation and a 50 mm  cup was chosen.  The final 50 mm Pinnacle cup was then impacted under fluoroscopy to confirm the depth of penetration and orientation with respect to   Abduction and forward flexion.  A screw was placed into the ilium followed by the hole eliminator.  The final   32+4  neutral Altrex liner was impacted with good visualized rim fit.  The cup was positioned anatomically within the acetabular portion of the pelvis.      At this point, the femur was rolled to 100 degrees.  Further capsule was   released off the inferior aspect of the femoral neck.  I then   released the superior capsule proximally.  With the leg in a neutral position the hook was placed laterally   along the femur under the vastus lateralis origin and elevated manually and then held in position using the hook attachment on the bed.  The leg was then extended and adducted with the leg rolled to 100   degrees of external rotation.  Retractors were placed along the medial calcar and posteriorly over the greater trochanter.  Once the proximal femur was fully   exposed, I used a box osteotome to set orientation.  I then began   broaching with the starting chili pepper broach and passed this by hand and then broached up to 2.  With the 2 broach in place I chose a high offset neck and did several trial reductions.  The offset was appropriate, leg lengths   appeared to be equal best matched with the +1 head ball trial confirmed radiographically.   Given these findings, I went ahead and dislocated the hip, repositioned all   retractors and positioned the right hip in the extended and abducted position.  The final 2 Hi Actis stem was   chosen and it was impacted down to the level of neck cut.  Based on this   and the trial reductions, a final 32+1 Articuleze metal head ball was chosen and   impacted onto a clean and dry trunnion, and the hip was reduced.  The   hip had been irrigated throughout the case again at this point.  I did   reapproximate the superior capsular leaflet to the anterior leaflet   using #1 Vicryl.  The fascia of the   tensor fascia lata muscle was then reapproximated using #1 Vicryl and #0 Stratafix sutures.  The   remaining wound was closed with 2-0 Vicryl and running 4-0 Monocryl.    The hip was cleaned, dried, and dressed sterilely using Dermabond and   Aquacel dressing.  The patient was then brought   to recovery room in stable  condition tolerating the procedure well.    Danae Orleans, PA-C was present for the entirety of the case involved from   preoperative positioning, perioperative retractor management, general   facilitation of the case, as well as primary wound closure as assistant.            Pietro Cassis Alvan Dame, M.D.        05/08/2019 9:46 AM

## 2019-05-08 NOTE — Evaluation (Signed)
Physical Therapy Evaluation Patient Details Name: Danielle Tate MRN: [id number] DOB: September 02, 1941 Today's Date: 05/08/2019   History of Present Illness  s/p L DA  THA  Clinical Impression  Pt admitted with above diagnosis. Pt currently with functional limitations due to the deficits listed below (see PT Problem List).  Pt amb ~ 6' with RW and min assist. Pt with HR up to 132 with amb and SpO2=85% after return to room/amb on RA. Sats incr to 93% with 2L O2 replaced and  HR 112, pt without c/o, RN made aware  Pt will benefit from skilled PT to increase their independence and safety with mobility to allow discharge to the venue listed below.       Follow Up Recommendations Follow surgeon's recommendation for DC plan and follow-up therapies    Equipment Recommendations  None recommended by PT    Recommendations for Other Services       Precautions / Restrictions Precautions Precautions: Fall Restrictions Weight Bearing Restrictions: No Other Position/Activity Restrictions: WBAT      Mobility  Bed Mobility Overal bed mobility: Needs Assistance Bed Mobility: Supine to Sit     Supine to sit: HOB elevated;Min assist     General bed mobility comments: assist with LLE, incr time, effortful  Transfers Overall transfer level: Needs assistance Equipment used: Rolling walker (2 wheeled) Transfers: Sit to/from Stand Sit to Stand: Min assist         General transfer comment: assist to rise and transition to RW, cues for hand placement  Ambulation/Gait Ambulation/Gait assistance: Min assist;Min guard Gait Distance (Feet): 55 Feet Assistive device: Rolling walker (2 wheeled) Gait Pattern/deviations: Step-to pattern     General Gait Details: cues for sequence, step length, RW position  Stairs            Wheelchair Mobility    Modified Rankin (Stroke Patients Only)       Balance                                             Pertinent  Vitals/Pain Pain Assessment: 0-10 Pain Score: 4  Pain Location: L hip Pain Descriptors / Indicators: Grimacing;Sore Pain Intervention(s): Limited activity within patient's tolerance;Monitored during session;Ice applied    Home Living Family/patient expects to be discharged to:: Private residence Living Arrangements: Alone Available Help at Discharge: Family(sister) Type of Home: House Home Access: Stairs to enter   [employer] of Steps: 2 Home Layout: One level Home Equipment: Cane - single point;Walker - 2 wheels;Bedside commode      Prior Function Level of Independence: Independent               Hand Dominance        Extremity/Trunk Assessment   Upper Extremity Assessment Upper Extremity Assessment: Overall WFL for tasks assessed    Lower Extremity Assessment Lower Extremity Assessment: LLE deficits/detail LLE Deficits / Details: ankle WFL; knee and hip grossly 2+/5 to 3/5       Communication   Communication: No difficulties  Cognition Arousal/Alertness: Awake/alert Behavior During Therapy: WFL for tasks assessed/performed Overall Cognitive Status: Within Functional Limits for tasks assessed                                        General Comments  Exercises Total Joint Exercises Ankle Circles/Pumps: AROM;Both;10 reps Quad Sets: 5 reps;AROM;Both   Assessment/Plan    PT Assessment Patient needs continued PT services  PT Problem List         PT Treatment Interventions DME instruction;Gait training;Functional mobility training;Therapeutic activities;Patient/family education;Therapeutic exercise;Stair training    PT Goals (Current goals can be found in the Care Plan section)  Acute Rehab PT Goals PT Goal Formulation: With patient Time For Goal Achievement: 05/15/19 Potential to Achieve Goals: Good    Frequency 7X/week   Barriers to discharge        Co-evaluation               AM-PAC PT "6 Clicks"  Mobility  Outcome Measure Help needed turning from your back to your side while in a flat bed without using bedrails?: A Little Help needed moving from lying on your back to sitting on the side of a flat bed without using bedrails?: A Little Help needed moving to and from a bed to a chair (including a wheelchair)?: A Little Help needed standing up from a chair using your arms (e.g., wheelchair or bedside chair)?: A Little Help needed to walk in hospital room?: A Little Help needed climbing 3-5 steps with a railing? : A Lot 6 Click Score: 17    End of Session Equipment Utilized During Treatment: Gait belt Activity Tolerance: Patient tolerated treatment well Patient left: in chair;with call bell/phone within reach;with chair alarm set Nurse Communication: Mobility status PT Visit Diagnosis: Difficulty in walking, not elsewhere classified (R26.2);Unsteadiness on feet (R26.81)    Time: 3094-0768 PT Time Calculation (min) (ACUTE ONLY): 19 min   Charges:   PT Evaluation $PT Eval Low Complexity: 1 Low          Kenyon Ana, PT  Pager: [phone] Acute Rehab Dept [facility]): [phone]   05/08/2019   [facility] 05/08/2019, 5:37 PM

## 2019-05-08 NOTE — Plan of Care (Signed)

## 2019-05-08 NOTE — Transfer of Care (Signed)
Immediate Anesthesia Transfer of Care Note  Patient: Danielle Tate  Procedure(s) Performed: TOTAL HIP ARTHROPLASTY ANTERIOR APPROACH (Left Hip)  Patient Location: PACU  Anesthesia Type:MAC and Spinal  Level of Consciousness: awake, alert  and oriented  Airway & Oxygen Therapy: Patient Spontanous Breathing and Patient connected to face mask oxygen  Post-op Assessment: Report given to RN and Post -op Vital signs reviewed and stable  Post vital signs: Reviewed and stable  Last Vitals:  Vitals Value Taken Time  BP 122/77 05/08/19 1239  Temp    Pulse 73 05/08/19 1240  Resp 17 05/08/19 1240  SpO2 100 % 05/08/19 1240  Vitals shown include unvalidated device data.  Last Pain:  Vitals:   05/08/19 0939  TempSrc:   PainSc: 5       Patients Stated Pain Goal: 4 (23/53/61 4431)  Complications: No apparent anesthesia complications

## 2019-05-08 NOTE — Progress Notes (Signed)
Mary, pharmacist notified that pt has penicillin allergy, will speak to [facility] to get changed.

## 2019-05-08 NOTE — Interval H&P Note (Signed)
History and Physical Interval Note:  05/08/2019 9:38 AM  Danielle Tate  has presented today for surgery, with the diagnosis of Left hip osteoarthritis.  The various methods of treatment have been discussed with the patient and family. After consideration of risks, benefits and other options for treatment, the patient has consented to  Procedure(s): TOTAL HIP ARTHROPLASTY ANTERIOR APPROACH (Left) as a surgical intervention.  The patient's history has been reviewed, patient examined, no change in status, stable for surgery.  I have reviewed the patient's chart and labs.  Questions were answered to the patient's satisfaction.     Mauri Pole

## 2019-05-09 DIAGNOSIS — Z1159 Encounter for screening for other viral diseases: Secondary | ICD-10-CM | POA: Diagnosis not present

## 2019-05-09 DIAGNOSIS — M1612 Unilateral primary osteoarthritis, left hip: Secondary | ICD-10-CM | POA: Diagnosis not present

## 2019-05-09 DIAGNOSIS — Z7982 Long term (current) use of aspirin: Secondary | ICD-10-CM | POA: Diagnosis not present

## 2019-05-09 DIAGNOSIS — Z791 Long term (current) use of non-steroidal anti-inflammatories (NSAID): Secondary | ICD-10-CM | POA: Diagnosis not present

## 2019-05-09 DIAGNOSIS — E669 Obesity, unspecified: Secondary | ICD-10-CM | POA: Diagnosis not present

## 2019-05-09 DIAGNOSIS — Z90710 Acquired absence of both cervix and uterus: Secondary | ICD-10-CM | POA: Diagnosis not present

## 2019-05-09 DIAGNOSIS — I10 Essential (primary) hypertension: Secondary | ICD-10-CM | POA: Diagnosis not present

## 2019-05-09 DIAGNOSIS — Z87891 Personal history of nicotine dependence: Secondary | ICD-10-CM | POA: Diagnosis not present

## 2019-05-09 DIAGNOSIS — Z79899 Other long term (current) drug therapy: Secondary | ICD-10-CM | POA: Diagnosis not present

## 2019-05-09 LAB — BASIC METABOLIC PANEL
Anion gap: 11 (ref 5–15)
BUN: 18 mg/dL (ref 8–23)
CO2: 26 mmol/L (ref 22–32)
Calcium: 8.5 mg/dL — ABNORMAL LOW (ref 8.9–10.3)
Chloride: 104 mmol/L (ref 98–111)
Creatinine, Ser: 0.71 mg/dL (ref 0.44–1.00)
GFR calc Af Amer: >60 mL/min (ref >60)
GFR calc non Af Amer: >60 mL/min (ref >60)
Glucose, Bld: 130 mg/dL — ABNORMAL HIGH (ref 70–99)
Potassium: 3.6 mmol/L (ref 3.5–5.1)
Sodium: 141 mmol/L (ref 135–145)

## 2019-05-09 LAB — CBC
HCT: 32.7 % — ABNORMAL LOW (ref 36.0–46.0)
Hemoglobin: 10.2 g/dL — ABNORMAL LOW (ref 12.0–15.0)
MCH: 26.8 pg (ref 26.0–34.0)
MCHC: 31.2 g/dL (ref 30.0–36.0)
MCV: 86.1 fL (ref 80.0–100.0)
Platelets: 184 10*3/uL (ref 150–400)
RBC: 3.8 MIL/uL — ABNORMAL LOW (ref 3.87–5.11)
RDW: 14.1 % (ref 11.5–15.5)
WBC: 11.6 10*3/uL — ABNORMAL HIGH (ref 4.0–10.5)
nRBC: 0 % (ref 0.0–0.2)

## 2019-05-09 MED ORDER — POLYETHYLENE GLYCOL 3350 17 G PO PACK: 17.0000 g | PACK | Freq: Two times a day (BID) | ORAL | 0 refills | Status: DC

## 2019-05-09 MED ORDER — ASPIRIN 81 MG PO CHEW: 81.0000 mg | CHEWABLE_TABLET | Freq: Two times a day (BID) | ORAL | 0 refills | Status: AC

## 2019-05-09 MED ORDER — HYDROCODONE-ACETAMINOPHEN 7.5-325 MG PO TABS: 1.0000 | ORAL_TABLET | ORAL | 0 refills | Status: DC | PRN

## 2019-05-09 MED ORDER — DOCUSATE SODIUM 100 MG PO CAPS: 100.0000 mg | ORAL_CAPSULE | Freq: Two times a day (BID) | ORAL | 0 refills | Status: AC

## 2019-05-09 MED ORDER — FERROUS SULFATE 325 (65 FE) MG PO TABS: 325.0000 mg | ORAL_TABLET | Freq: Three times a day (TID) | ORAL | 3 refills | Status: DC

## 2019-05-09 MED ORDER — METHOCARBAMOL 500 MG PO TABS: 500.0000 mg | ORAL_TABLET | Freq: Four times a day (QID) | ORAL | 0 refills | Status: DC | PRN

## 2019-05-09 NOTE — Progress Notes (Signed)
Discharge paperwork discussed with pt at the bedside.  She demonstrated understanding. Waiting for ride.  Pt to be escorted to main lobby by wheelchair.

## 2019-05-09 NOTE — Plan of Care (Signed)

## 2019-05-09 NOTE — Progress Notes (Signed)
     Subjective: 1 Day Post-Op Procedure(s) (LRB): TOTAL HIP ARTHROPLASTY ANTERIOR APPROACH (Left)   Seen by Dr. Alvan Dame. Patient reports pain as mild, pain controlled. No events throughout the night. Procedure and expectations reviewed.  Ready to be discharged home if she does well with PT.    Objective:   VITALS:   Vitals:   05/09/19 0113 05/09/19 0500  BP: 127/89 (!) 145/78  Pulse: 82 89  Resp: 16 16  Temp: 98.6 F (37 C) (!) 97.5 F (36.4 C)  SpO2: 100% 97%    Dorsiflexion/Plantar flexion intact Incision: dressing C/D/I No cellulitis present Compartment soft  LABS Recent Labs    05/09/19 0318  HGB 10.2*  HCT 32.7*  WBC 11.6*  PLT 184    Recent Labs    05/08/19 0925 05/09/19 0318  NA 144 141  K 3.2* 3.6  BUN 27* 18  CREATININE 0.82 0.71  GLUCOSE 96 130*     Assessment/Plan: 1 Day Post-Op Procedure(s) (LRB): TOTAL HIP ARTHROPLASTY ANTERIOR APPROACH (Left) Foley cath d/c'ed Advance diet Up with therapy D/C IV fluids Discharge home Follow up in 2 weeks at [facility] ([place]). Follow up with OLIN,Raneem Mendolia D in 2 weeks.  Contact information:  [facility]) [address], [place] [phone]    Obese (BMI 30-39.9)  Estimated body mass index is 31.53 kg/m as calculated from the following:   Height as of this encounter: 5\' 3"  (1.6 m).   Weight as of this encounter: 80.7 kg. Patient also counseled that weight may inhibit the healing process Patient counseled that losing weight will help with future health issues      Danielle Tate. Danielle Tate   PAC  05/09/2019, 7:44 AM

## 2019-05-09 NOTE — Progress Notes (Signed)
Physical Therapy Treatment Patient Details Name: Danielle Tate MRN: [id number] DOB: November 26, 1940 Today's Date: 05/09/2019    History of Present Illness s/p L DA  THA    PT Comments    POD # 1 Demonstrated and instructed pt how to use belt to self assist LE as a leg lifter General transfer comment: 255 VC's on proper hand placement and safety with turns. General Gait Details: 25% VC's on proper walker to self distance and safety with turns. Then returned to room to perform some TE's following HEP handout.  Instructed on proper tech, freq as well as use of ICE.   Addressed all mobility questions, discussed appropriate activity, educated on use of ICE.  Pt ready for D/C to home.   Follow Up Recommendations  Follow surgeon's recommendation for DC plan and follow-up therapies     Equipment Recommendations  None recommended by PT    Recommendations for Other Services       Precautions / Restrictions Precautions Precautions: Fall Restrictions Weight Bearing Restrictions: No Other Position/Activity Restrictions: WBAT    Mobility  Bed Mobility Overal bed mobility: Needs Assistance Bed Mobility: Supine to Sit     Supine to sit: HOB elevated;Min guard;Supervision     General bed mobility comments: increased time  Transfers Overall transfer level: Needs assistance Equipment used: Rolling walker (2 wheeled) Transfers: Sit to/from Stand Sit to Stand: Min assist         General transfer comment: 255 VC's on proper hand placement and safety with turns  Ambulation/Gait Ambulation/Gait assistance: Min guard;Supervision Gait Distance (Feet): 22 Feet Assistive device: Rolling walker (2 wheeled) Gait Pattern/deviations: Step-to pattern;Decreased stance time - left Gait velocity: decreased   General Gait Details: 25% VC's on proper walker to self distance and safety with turns   Stairs Stairs: Yes Stairs assistance: Min guard;Min assist Stair Management: Two rails;Step  to pattern;Forwards Number of Stairs: 2 General stair comments: 50% VC's on proper sequencing and safety   Wheelchair Mobility    Modified Rankin (Stroke Patients Only)       Balance                                            Cognition Arousal/Alertness: Awake/alert Behavior During Therapy: WFL for tasks assessed/performed Overall Cognitive Status: Within Functional Limits for tasks assessed                                        Exercises   Total Hip Replacement TE's 10 reps ankle pumps 10 reps knee presses 10 reps heel slides 10 reps SAQ's 10 reps ABD Followed by ICE     General Comments        Pertinent Vitals/Pain Pain Assessment: 0-10 Pain Score: 3  Pain Location: L hip Pain Descriptors / Indicators: Grimacing;Sore Pain Intervention(s): Premedicated before session;Repositioned;Ice applied;Monitored during session    Home Living                      Prior Function            PT Goals (current goals can now be found in the care plan section)      Frequency    7X/week      PT Plan Current plan remains appropriate    Co-evaluation  AM-PAC PT "6 Clicks" Mobility   Outcome Measure  Help needed turning from your back to your side while in a flat bed without using bedrails?: A Little Help needed moving from lying on your back to sitting on the side of a flat bed without using bedrails?: A Little Help needed moving to and from a bed to a chair (including a wheelchair)?: A Little Help needed standing up from a chair using your arms (e.g., wheelchair or bedside chair)?: A Little Help needed to walk in hospital room?: A Little   6 Click Score: 15    End of Session Equipment Utilized During Treatment: Gait belt Activity Tolerance: Patient tolerated treatment well Patient left: in chair;with call bell/phone within reach;with chair alarm set Nurse Communication: Mobility status PT Visit  Diagnosis: Difficulty in walking, not elsewhere classified (R26.2);Unsteadiness on feet (R26.81)     Time: 9480-1655 PT Time Calculation (min) (ACUTE ONLY): 25 min  Charges:  $Gait Training: 8-22 mins $Therapeutic Exercise: 8-22 mins                     Rica Koyanagi  PTA Acute  Rehabilitation Services Pager      [phone] Office      [phone]

## 2019-05-09 NOTE — TOC Transition Note (Signed)
Transition of Care [facility]) - CM/SW Discharge Note   Patient Details  Name: Danielle Tate MRN: [id number] Date of Birth: 07-16-1941  [facility] CM/SW Contact:  Leeroy Cha, RN Phone Number: 05/09/2019, 11:28 AM   Clinical Narrative:    dcd to home with equipment.   Final next level of care: Home/Self Care Barriers to Discharge: No Barriers Identified   Patient Goals and CMS Choice Patient states their goals for this hospitalization and ongoing recovery are:: to do the hep CMS Medicare.gov Compare Post Acute Care list provided to:: Patient    Discharge Placement                       Discharge Plan and Services   Discharge Planning Services: CM Consult            DME Arranged: Gilford Rile rolling, 3-N-1 DME Agency: [employer] Date DME Agency Contacted: 05/09/19 Time DME Agency Contacted: 0900 Representative spoke with at DME Agency: [place] [facility] Interventions     Readmission Risk Interventions No flowsheet data found.

## 2019-05-09 NOTE — Plan of Care (Signed)

## 2019-05-12 ENCOUNTER — Encounter (HOSPITAL_COMMUNITY): Payer: Self-pay | Admitting: Orthopedic Surgery

## 2019-05-13 NOTE — Discharge Summary (Signed)
Physician Discharge Summary  Patient ID: Danielle Tate MRN: [id number] DOB/AGE: Feb 07, 1941 78 y.o.  Admit date: 05/08/2019 Discharge date: 05/09/2019   Procedures:  Procedure(s) (LRB): TOTAL HIP ARTHROPLASTY ANTERIOR APPROACH (Left)  Attending Physician:  Dr. Paralee Cancel   Admission Diagnoses:   Left hip primary OA / pain  Discharge Diagnoses:  Active Problems:   S/P left THA, AA   Osteoarthritis of left hip  Past Medical History:  Diagnosis Date  . Hypertension     HPI:    Danielle Tate, 78 y.o. female, has a history of pain and functional disability in the left hip(s) due to arthritis and patient has failed non-surgical conservative treatments for greater than 12 weeks to include NSAID's and/or analgesics, corticosteriod injections, use of assistive devices and activity modification.  Onset of symptoms was gradual starting < 1 year ago with rapidlly worsening course since that time.The patient noted no past surgery on the left hip(s).  Patient currently rates pain in the left hip at 10 out of 10 with activity. Patient has worsening of pain with activity and weight bearing, trendelenberg gait, pain that interfers with activities of daily living and pain with passive range of motion. Patient has evidence of periarticular osteophytes and joint space narrowing by imaging studies. This condition presents safety issues increasing the risk of falls. There is no current active infection.  Risks, benefits and expectations were discussed with the patient.  Risks including but not limited to the risk of anesthesia, blood clots, nerve damage, blood vessel damage, failure of the prosthesis, infection and up to and including death.  Patient understand the risks, benefits and expectations and wishes to proceed with surgery.   PCP: Benito Mccreedy, MD   Discharged Condition: good  Hospital Course:  Patient underwent the above stated procedure on 05/08/2019. Patient tolerated the procedure  well and brought to the recovery room in good condition and subsequently to the floor.  POD #1 BP: 145/78 ; Pulse: 89 ; Temp: 97.5 F (36.4 C) ; Resp: 16 Patient reports pain as mild, pain controlled. No events throughout the night. Procedure and expectations reviewed.  Ready to be discharged home. Dorsiflexion/plantar flexion intact, incision: dressing C/D/I, no cellulitis present and compartment soft.   LABS  Basename    HGB     10.2  HCT     32.7    Discharge Exam: General appearance: alert, cooperative and no distress Extremities: Homans sign is negative, no sign of DVT, no edema, redness or tenderness in the calves or thighs and no ulcers, gangrene or trophic changes  Disposition:  Home with follow up in 2 weeks   Follow-up Information    Paralee Cancel, MD. Schedule an appointment as soon as possible for a visit in 2 weeks.   Specialty: Orthopedic Surgery Contact information: [address] [place] [address] [phone]           Discharge Instructions    Call MD / Call 911   Complete by: As directed    If you experience chest pain or shortness of breath, CALL 911 and be transported to the hospital emergency room.  If you develope a fever above 101 F, pus (white drainage) or increased drainage or redness at the wound, or calf pain, call your surgeon's office.   Change dressing   Complete by: As directed    Maintain surgical dressing until follow up in the clinic. If the edges start to pull up, may reinforce with tape. If the dressing  is no longer working, may remove and cover with gauze and tape, but must keep the area dry and clean.  Call with any questions or concerns.   Constipation Prevention   Complete by: As directed    Drink plenty of fluids.  Prune juice may be helpful.  You may use a stool softener, such as Colace (over the counter) 100 mg twice a day.  Use MiraLax (over the counter) for constipation as needed.   Diet - low sodium heart  healthy   Complete by: As directed    Discharge instructions   Complete by: As directed    Maintain surgical dressing until follow up in the clinic. If the edges start to pull up, may reinforce with tape. If the dressing is no longer working, may remove and cover with gauze and tape, but must keep the area dry and clean.  Follow up in 2 weeks at [facility] Call with any questions or concerns.   Increase activity slowly as tolerated   Complete by: As directed    Weight bearing as tolerated with assist device (walker, cane, etc) as directed, use it as long as suggested by your surgeon or therapist, typically at least 4-6 weeks.   TED hose   Complete by: As directed    Use stockings (TED hose) for 2 weeks on both leg(s).  You may remove them at night for sleeping.      Allergies as of 05/09/2019      Reactions   Penicillins Nausea And Vomiting   Made arms swell, nausea and vomiting.      Medication List    STOP taking these medications   aspirin EC 81 MG tablet Replaced by: aspirin 81 MG chewable tablet   meloxicam 7.5 MG tablet Commonly known as: MOBIC   TYLENOL PO     TAKE these medications   amLODipine-benazepril 5-20 MG capsule Commonly known as: LOTREL Take 1 capsule by mouth daily.   aspirin 81 MG chewable tablet Commonly known as: Aspirin Childrens Chew 1 tablet (81 mg total) by mouth 2 (two) times daily for 30 days. Take for 4 weeks, then resume regular dose. Replaces: aspirin EC 81 MG tablet   atorvastatin 40 MG tablet Commonly known as: LIPITOR Take 40 mg by mouth daily.   cetirizine 10 MG tablet Commonly known as: ZYRTEC Take 10 mg by mouth daily as needed for allergies.   docusate sodium 100 MG capsule Commonly known as: Colace Take 1 capsule (100 mg total) by mouth 2 (two) times daily.   ferrous sulfate 325 (65 FE) MG tablet Commonly known as: FerrouSul Take 1 tablet (325 mg total) by mouth 3 (three) times daily with meals.    hydrochlorothiazide 25 MG tablet Commonly known as: HYDRODIURIL Take 25 mg by mouth daily.   HYDROcodone-acetaminophen 7.5-325 MG tablet Commonly known as: Norco Take 1-2 tablets by mouth every 4 (four) hours as needed for moderate pain.   methocarbamol 500 MG tablet Commonly known as: Robaxin Take 1 tablet (500 mg total) by mouth every 6 (six) hours as needed for muscle spasms.   omeprazole 40 MG capsule Commonly known as: PRILOSEC Take 40 mg by mouth daily.   polyethylene glycol 17 g packet Commonly known as: MIRALAX / GLYCOLAX Take 17 g by mouth 2 (two) times daily.            Discharge Care Instructions  (From admission, onward)         Start     Ordered  05/09/19 0000  Change dressing    Comments: Maintain surgical dressing until follow up in the clinic. If the edges start to pull up, may reinforce with tape. If the dressing is no longer working, may remove and cover with gauze and tape, but must keep the area dry and clean.  Call with any questions or concerns.   05/09/19 0746           Signed: [place]. Yassmin Binegar   PA-C  05/13/2019, 8:22 AM

## 2019-06-19 DIAGNOSIS — Z471 Aftercare following joint replacement surgery: Secondary | ICD-10-CM | POA: Diagnosis not present

## 2019-06-19 DIAGNOSIS — Z96642 Presence of left artificial hip joint: Secondary | ICD-10-CM | POA: Diagnosis not present

## 2019-07-08 DIAGNOSIS — E559 Vitamin D deficiency, unspecified: Secondary | ICD-10-CM | POA: Diagnosis not present

## 2019-07-08 DIAGNOSIS — Z1389 Encounter for screening for other disorder: Secondary | ICD-10-CM | POA: Diagnosis not present

## 2019-07-08 DIAGNOSIS — E1165 Type 2 diabetes mellitus with hyperglycemia: Secondary | ICD-10-CM | POA: Diagnosis not present

## 2019-07-08 DIAGNOSIS — E669 Obesity, unspecified: Secondary | ICD-10-CM | POA: Diagnosis not present

## 2019-07-08 DIAGNOSIS — Z0001 Encounter for general adult medical examination with abnormal findings: Secondary | ICD-10-CM | POA: Diagnosis not present

## 2019-07-08 DIAGNOSIS — I10 Essential (primary) hypertension: Secondary | ICD-10-CM | POA: Diagnosis not present

## 2019-07-08 DIAGNOSIS — E782 Mixed hyperlipidemia: Secondary | ICD-10-CM | POA: Diagnosis not present

## 2019-07-08 DIAGNOSIS — J302 Other seasonal allergic rhinitis: Secondary | ICD-10-CM | POA: Diagnosis not present

## 2019-07-08 DIAGNOSIS — Z87891 Personal history of nicotine dependence: Secondary | ICD-10-CM | POA: Diagnosis not present

## 2019-08-21 DIAGNOSIS — J302 Other seasonal allergic rhinitis: Secondary | ICD-10-CM | POA: Diagnosis not present

## 2019-08-21 DIAGNOSIS — Z23 Encounter for immunization: Secondary | ICD-10-CM | POA: Diagnosis not present

## 2019-08-21 DIAGNOSIS — E1165 Type 2 diabetes mellitus with hyperglycemia: Secondary | ICD-10-CM | POA: Diagnosis not present

## 2019-08-21 DIAGNOSIS — K219 Gastro-esophageal reflux disease without esophagitis: Secondary | ICD-10-CM | POA: Diagnosis not present

## 2019-08-21 DIAGNOSIS — E559 Vitamin D deficiency, unspecified: Secondary | ICD-10-CM | POA: Diagnosis not present

## 2019-08-21 DIAGNOSIS — I10 Essential (primary) hypertension: Secondary | ICD-10-CM | POA: Diagnosis not present

## 2019-08-21 DIAGNOSIS — Z87891 Personal history of nicotine dependence: Secondary | ICD-10-CM | POA: Diagnosis not present

## 2019-08-21 DIAGNOSIS — E669 Obesity, unspecified: Secondary | ICD-10-CM | POA: Diagnosis not present

## 2019-08-21 DIAGNOSIS — E782 Mixed hyperlipidemia: Secondary | ICD-10-CM | POA: Diagnosis not present

## 2019-11-10 DIAGNOSIS — H40053 Ocular hypertension, bilateral: Secondary | ICD-10-CM | POA: Diagnosis not present

## 2019-11-10 DIAGNOSIS — H40023 Open angle with borderline findings, high risk, bilateral: Secondary | ICD-10-CM | POA: Diagnosis not present

## 2019-11-10 DIAGNOSIS — H43813 Vitreous degeneration, bilateral: Secondary | ICD-10-CM | POA: Diagnosis not present

## 2019-11-10 DIAGNOSIS — E119 Type 2 diabetes mellitus without complications: Secondary | ICD-10-CM | POA: Diagnosis not present

## 2019-11-19 DIAGNOSIS — H25813 Combined forms of age-related cataract, bilateral: Secondary | ICD-10-CM | POA: Diagnosis not present

## 2019-11-19 DIAGNOSIS — H40023 Open angle with borderline findings, high risk, bilateral: Secondary | ICD-10-CM | POA: Diagnosis not present

## 2019-11-19 DIAGNOSIS — H43813 Vitreous degeneration, bilateral: Secondary | ICD-10-CM | POA: Diagnosis not present

## 2019-11-19 DIAGNOSIS — H40053 Ocular hypertension, bilateral: Secondary | ICD-10-CM | POA: Diagnosis not present

## 2019-11-27 DIAGNOSIS — K219 Gastro-esophageal reflux disease without esophagitis: Secondary | ICD-10-CM | POA: Diagnosis not present

## 2019-11-27 DIAGNOSIS — Z87891 Personal history of nicotine dependence: Secondary | ICD-10-CM | POA: Diagnosis not present

## 2019-11-27 DIAGNOSIS — J302 Other seasonal allergic rhinitis: Secondary | ICD-10-CM | POA: Diagnosis not present

## 2019-11-27 DIAGNOSIS — I10 Essential (primary) hypertension: Secondary | ICD-10-CM | POA: Diagnosis not present

## 2019-11-27 DIAGNOSIS — E669 Obesity, unspecified: Secondary | ICD-10-CM | POA: Diagnosis not present

## 2019-11-27 DIAGNOSIS — E782 Mixed hyperlipidemia: Secondary | ICD-10-CM | POA: Diagnosis not present

## 2019-11-27 DIAGNOSIS — E559 Vitamin D deficiency, unspecified: Secondary | ICD-10-CM | POA: Diagnosis not present

## 2019-11-27 DIAGNOSIS — E1165 Type 2 diabetes mellitus with hyperglycemia: Secondary | ICD-10-CM | POA: Diagnosis not present

## 2019-11-27 DIAGNOSIS — B372 Candidiasis of skin and nail: Secondary | ICD-10-CM | POA: Diagnosis not present

## 2020-01-21 DIAGNOSIS — H40053 Ocular hypertension, bilateral: Secondary | ICD-10-CM | POA: Diagnosis not present

## 2020-01-21 DIAGNOSIS — E119 Type 2 diabetes mellitus without complications: Secondary | ICD-10-CM | POA: Diagnosis not present

## 2020-01-21 DIAGNOSIS — H25813 Combined forms of age-related cataract, bilateral: Secondary | ICD-10-CM | POA: Diagnosis not present

## 2020-01-21 DIAGNOSIS — H40023 Open angle with borderline findings, high risk, bilateral: Secondary | ICD-10-CM | POA: Diagnosis not present

## 2020-02-26 DIAGNOSIS — E782 Mixed hyperlipidemia: Secondary | ICD-10-CM | POA: Diagnosis not present

## 2020-02-26 DIAGNOSIS — K219 Gastro-esophageal reflux disease without esophagitis: Secondary | ICD-10-CM | POA: Diagnosis not present

## 2020-02-26 DIAGNOSIS — N3001 Acute cystitis with hematuria: Secondary | ICD-10-CM | POA: Diagnosis not present

## 2020-02-26 DIAGNOSIS — Z87891 Personal history of nicotine dependence: Secondary | ICD-10-CM | POA: Diagnosis not present

## 2020-02-26 DIAGNOSIS — I10 Essential (primary) hypertension: Secondary | ICD-10-CM | POA: Diagnosis not present

## 2020-02-26 DIAGNOSIS — E559 Vitamin D deficiency, unspecified: Secondary | ICD-10-CM | POA: Diagnosis not present

## 2020-02-26 DIAGNOSIS — E669 Obesity, unspecified: Secondary | ICD-10-CM | POA: Diagnosis not present

## 2020-02-26 DIAGNOSIS — E1165 Type 2 diabetes mellitus with hyperglycemia: Secondary | ICD-10-CM | POA: Diagnosis not present

## 2020-02-26 DIAGNOSIS — J302 Other seasonal allergic rhinitis: Secondary | ICD-10-CM | POA: Diagnosis not present

## 2020-03-04 DIAGNOSIS — E559 Vitamin D deficiency, unspecified: Secondary | ICD-10-CM | POA: Diagnosis not present

## 2020-03-04 DIAGNOSIS — Z87891 Personal history of nicotine dependence: Secondary | ICD-10-CM | POA: Diagnosis not present

## 2020-03-04 DIAGNOSIS — E782 Mixed hyperlipidemia: Secondary | ICD-10-CM | POA: Diagnosis not present

## 2020-03-04 DIAGNOSIS — J302 Other seasonal allergic rhinitis: Secondary | ICD-10-CM | POA: Diagnosis not present

## 2020-03-04 DIAGNOSIS — I10 Essential (primary) hypertension: Secondary | ICD-10-CM | POA: Diagnosis not present

## 2020-03-04 DIAGNOSIS — E1165 Type 2 diabetes mellitus with hyperglycemia: Secondary | ICD-10-CM | POA: Diagnosis not present

## 2020-03-04 DIAGNOSIS — R3915 Urgency of urination: Secondary | ICD-10-CM | POA: Diagnosis not present

## 2020-03-04 DIAGNOSIS — E669 Obesity, unspecified: Secondary | ICD-10-CM | POA: Diagnosis not present

## 2020-03-04 DIAGNOSIS — K219 Gastro-esophageal reflux disease without esophagitis: Secondary | ICD-10-CM | POA: Diagnosis not present

## 2020-04-29 DIAGNOSIS — Z1231 Encounter for screening mammogram for malignant neoplasm of breast: Secondary | ICD-10-CM | POA: Diagnosis not present

## 2020-04-30 DIAGNOSIS — K219 Gastro-esophageal reflux disease without esophagitis: Secondary | ICD-10-CM | POA: Diagnosis not present

## 2020-04-30 DIAGNOSIS — R05 Cough: Secondary | ICD-10-CM | POA: Diagnosis not present

## 2020-05-26 DIAGNOSIS — H43813 Vitreous degeneration, bilateral: Secondary | ICD-10-CM | POA: Diagnosis not present

## 2020-05-26 DIAGNOSIS — H25813 Combined forms of age-related cataract, bilateral: Secondary | ICD-10-CM | POA: Diagnosis not present

## 2020-05-26 DIAGNOSIS — H40053 Ocular hypertension, bilateral: Secondary | ICD-10-CM | POA: Diagnosis not present

## 2020-05-26 DIAGNOSIS — H40023 Open angle with borderline findings, high risk, bilateral: Secondary | ICD-10-CM | POA: Diagnosis not present

## 2020-07-09 DIAGNOSIS — Z1321 Encounter for screening for nutritional disorder: Secondary | ICD-10-CM | POA: Diagnosis not present

## 2020-07-09 DIAGNOSIS — Z87891 Personal history of nicotine dependence: Secondary | ICD-10-CM | POA: Diagnosis not present

## 2020-07-09 DIAGNOSIS — E782 Mixed hyperlipidemia: Secondary | ICD-10-CM | POA: Diagnosis not present

## 2020-07-09 DIAGNOSIS — E1165 Type 2 diabetes mellitus with hyperglycemia: Secondary | ICD-10-CM | POA: Diagnosis not present

## 2020-07-09 DIAGNOSIS — E559 Vitamin D deficiency, unspecified: Secondary | ICD-10-CM | POA: Diagnosis not present

## 2020-07-09 DIAGNOSIS — Z0001 Encounter for general adult medical examination with abnormal findings: Secondary | ICD-10-CM | POA: Diagnosis not present

## 2020-07-09 DIAGNOSIS — K219 Gastro-esophageal reflux disease without esophagitis: Secondary | ICD-10-CM | POA: Diagnosis not present

## 2020-07-09 DIAGNOSIS — N3281 Overactive bladder: Secondary | ICD-10-CM | POA: Diagnosis not present

## 2020-07-09 DIAGNOSIS — I10 Essential (primary) hypertension: Secondary | ICD-10-CM | POA: Diagnosis not present

## 2020-08-09 DIAGNOSIS — I10 Essential (primary) hypertension: Secondary | ICD-10-CM | POA: Diagnosis not present

## 2020-08-09 DIAGNOSIS — E559 Vitamin D deficiency, unspecified: Secondary | ICD-10-CM | POA: Diagnosis not present

## 2020-08-09 DIAGNOSIS — E669 Obesity, unspecified: Secondary | ICD-10-CM | POA: Diagnosis not present

## 2020-08-09 DIAGNOSIS — Z87891 Personal history of nicotine dependence: Secondary | ICD-10-CM | POA: Diagnosis not present

## 2020-08-09 DIAGNOSIS — K219 Gastro-esophageal reflux disease without esophagitis: Secondary | ICD-10-CM | POA: Diagnosis not present

## 2020-08-09 DIAGNOSIS — J302 Other seasonal allergic rhinitis: Secondary | ICD-10-CM | POA: Diagnosis not present

## 2020-08-09 DIAGNOSIS — E1165 Type 2 diabetes mellitus with hyperglycemia: Secondary | ICD-10-CM | POA: Diagnosis not present

## 2020-08-09 DIAGNOSIS — E782 Mixed hyperlipidemia: Secondary | ICD-10-CM | POA: Diagnosis not present

## 2020-08-09 DIAGNOSIS — R2681 Unsteadiness on feet: Secondary | ICD-10-CM | POA: Diagnosis not present

## 2020-08-12 DIAGNOSIS — G5601 Carpal tunnel syndrome, right upper limb: Secondary | ICD-10-CM | POA: Diagnosis not present

## 2020-08-31 DIAGNOSIS — M25561 Pain in right knee: Secondary | ICD-10-CM | POA: Diagnosis not present

## 2020-09-01 DIAGNOSIS — M1711 Unilateral primary osteoarthritis, right knee: Secondary | ICD-10-CM | POA: Diagnosis not present

## 2020-09-01 DIAGNOSIS — Z96642 Presence of left artificial hip joint: Secondary | ICD-10-CM | POA: Diagnosis not present

## 2020-09-01 DIAGNOSIS — M25561 Pain in right knee: Secondary | ICD-10-CM | POA: Diagnosis not present

## 2020-09-30 DIAGNOSIS — Z87891 Personal history of nicotine dependence: Secondary | ICD-10-CM | POA: Diagnosis not present

## 2020-09-30 DIAGNOSIS — E559 Vitamin D deficiency, unspecified: Secondary | ICD-10-CM | POA: Diagnosis not present

## 2020-09-30 DIAGNOSIS — E782 Mixed hyperlipidemia: Secondary | ICD-10-CM | POA: Diagnosis not present

## 2020-09-30 DIAGNOSIS — E1165 Type 2 diabetes mellitus with hyperglycemia: Secondary | ICD-10-CM | POA: Diagnosis not present

## 2020-09-30 DIAGNOSIS — J302 Other seasonal allergic rhinitis: Secondary | ICD-10-CM | POA: Diagnosis not present

## 2020-09-30 DIAGNOSIS — K219 Gastro-esophageal reflux disease without esophagitis: Secondary | ICD-10-CM | POA: Diagnosis not present

## 2020-09-30 DIAGNOSIS — R2681 Unsteadiness on feet: Secondary | ICD-10-CM | POA: Diagnosis not present

## 2020-09-30 DIAGNOSIS — I10 Essential (primary) hypertension: Secondary | ICD-10-CM | POA: Diagnosis not present

## 2020-09-30 DIAGNOSIS — N3281 Overactive bladder: Secondary | ICD-10-CM | POA: Diagnosis not present

## 2020-10-15 DIAGNOSIS — E1165 Type 2 diabetes mellitus with hyperglycemia: Secondary | ICD-10-CM | POA: Diagnosis not present

## 2020-10-15 DIAGNOSIS — E7800 Pure hypercholesterolemia, unspecified: Secondary | ICD-10-CM | POA: Diagnosis not present

## 2020-10-15 DIAGNOSIS — Z0001 Encounter for general adult medical examination with abnormal findings: Secondary | ICD-10-CM | POA: Diagnosis not present

## 2020-10-15 DIAGNOSIS — Z0000 Encounter for general adult medical examination without abnormal findings: Secondary | ICD-10-CM | POA: Diagnosis not present

## 2020-10-15 DIAGNOSIS — Z1321 Encounter for screening for nutritional disorder: Secondary | ICD-10-CM | POA: Diagnosis not present

## 2020-10-15 DIAGNOSIS — E119 Type 2 diabetes mellitus without complications: Secondary | ICD-10-CM | POA: Diagnosis not present

## 2020-10-15 DIAGNOSIS — Z6831 Body mass index (BMI) 31.0-31.9, adult: Secondary | ICD-10-CM | POA: Diagnosis not present

## 2020-10-15 DIAGNOSIS — Z79899 Other long term (current) drug therapy: Secondary | ICD-10-CM | POA: Diagnosis not present

## 2020-10-15 DIAGNOSIS — Z130 Encounter for screening for diseases of the blood and blood-forming organs and certain disorders involving the immune mechanism: Secondary | ICD-10-CM | POA: Diagnosis not present

## 2020-10-15 DIAGNOSIS — I10 Essential (primary) hypertension: Secondary | ICD-10-CM | POA: Diagnosis not present

## 2020-10-15 DIAGNOSIS — R202 Paresthesia of skin: Secondary | ICD-10-CM | POA: Diagnosis not present

## 2020-10-15 DIAGNOSIS — E669 Obesity, unspecified: Secondary | ICD-10-CM | POA: Diagnosis not present

## 2020-10-15 DIAGNOSIS — Z1389 Encounter for screening for other disorder: Secondary | ICD-10-CM | POA: Diagnosis not present

## 2020-10-26 DIAGNOSIS — K219 Gastro-esophageal reflux disease without esophagitis: Secondary | ICD-10-CM | POA: Diagnosis not present

## 2020-10-26 DIAGNOSIS — R202 Paresthesia of skin: Secondary | ICD-10-CM | POA: Diagnosis not present

## 2020-10-26 DIAGNOSIS — E119 Type 2 diabetes mellitus without complications: Secondary | ICD-10-CM | POA: Diagnosis not present

## 2020-10-26 DIAGNOSIS — R200 Anesthesia of skin: Secondary | ICD-10-CM | POA: Diagnosis not present

## 2020-11-16 DIAGNOSIS — R2231 Localized swelling, mass and lump, right upper limb: Secondary | ICD-10-CM | POA: Diagnosis not present

## 2020-11-16 DIAGNOSIS — G629 Polyneuropathy, unspecified: Secondary | ICD-10-CM | POA: Diagnosis not present

## 2020-11-16 DIAGNOSIS — M180 Bilateral primary osteoarthritis of first carpometacarpal joints: Secondary | ICD-10-CM | POA: Diagnosis not present

## 2020-11-16 DIAGNOSIS — M1711 Unilateral primary osteoarthritis, right knee: Secondary | ICD-10-CM | POA: Diagnosis not present

## 2020-11-16 DIAGNOSIS — R52 Pain, unspecified: Secondary | ICD-10-CM | POA: Diagnosis not present

## 2020-11-17 ENCOUNTER — Encounter (HOSPITAL_BASED_OUTPATIENT_CLINIC_OR_DEPARTMENT_OTHER): Payer: Self-pay | Admitting: *Deleted

## 2020-11-17 ENCOUNTER — Emergency Department (HOSPITAL_BASED_OUTPATIENT_CLINIC_OR_DEPARTMENT_OTHER): Payer: Medicare HMO

## 2020-11-17 ENCOUNTER — Emergency Department (HOSPITAL_BASED_OUTPATIENT_CLINIC_OR_DEPARTMENT_OTHER)
Admission: EM | Admit: 2020-11-17 | Discharge: 2020-11-17 | Disposition: A | Discharge status: Home / Self Care | Payer: Medicare HMO | Attending: Emergency Medicine | Admitting: Emergency Medicine

## 2020-11-17 ENCOUNTER — Other Ambulatory Visit: Payer: Self-pay

## 2020-11-17 DIAGNOSIS — Z79899 Other long term (current) drug therapy: Secondary | ICD-10-CM | POA: Diagnosis not present

## 2020-11-17 DIAGNOSIS — M25561 Pain in right knee: Secondary | ICD-10-CM | POA: Diagnosis not present

## 2020-11-17 DIAGNOSIS — M25551 Pain in right hip: Secondary | ICD-10-CM | POA: Diagnosis not present

## 2020-11-17 DIAGNOSIS — J392 Other diseases of pharynx: Secondary | ICD-10-CM

## 2020-11-17 DIAGNOSIS — Z87891 Personal history of nicotine dependence: Secondary | ICD-10-CM | POA: Diagnosis not present

## 2020-11-17 DIAGNOSIS — Z96642 Presence of left artificial hip joint: Secondary | ICD-10-CM | POA: Insufficient documentation

## 2020-11-17 DIAGNOSIS — J390 Retropharyngeal and parapharyngeal abscess: Secondary | ICD-10-CM | POA: Diagnosis not present

## 2020-11-17 DIAGNOSIS — S060X0A Concussion without loss of consciousness, initial encounter: Secondary | ICD-10-CM | POA: Insufficient documentation

## 2020-11-17 DIAGNOSIS — M7918 Myalgia, other site: Secondary | ICD-10-CM

## 2020-11-17 DIAGNOSIS — W228XXA Striking against or struck by other objects, initial encounter: Secondary | ICD-10-CM | POA: Diagnosis not present

## 2020-11-17 DIAGNOSIS — S199XXA Unspecified injury of neck, initial encounter: Secondary | ICD-10-CM | POA: Diagnosis not present

## 2020-11-17 DIAGNOSIS — M25511 Pain in right shoulder: Secondary | ICD-10-CM | POA: Diagnosis not present

## 2020-11-17 DIAGNOSIS — M4312 Spondylolisthesis, cervical region: Secondary | ICD-10-CM | POA: Diagnosis not present

## 2020-11-17 DIAGNOSIS — I6523 Occlusion and stenosis of bilateral carotid arteries: Secondary | ICD-10-CM | POA: Diagnosis not present

## 2020-11-17 DIAGNOSIS — S0990XA Unspecified injury of head, initial encounter: Secondary | ICD-10-CM | POA: Diagnosis not present

## 2020-11-17 DIAGNOSIS — I10 Essential (primary) hypertension: Secondary | ICD-10-CM | POA: Diagnosis not present

## 2020-11-17 DIAGNOSIS — M542 Cervicalgia: Secondary | ICD-10-CM | POA: Insufficient documentation

## 2020-11-17 DIAGNOSIS — M47812 Spondylosis without myelopathy or radiculopathy, cervical region: Secondary | ICD-10-CM | POA: Diagnosis not present

## 2020-11-17 DIAGNOSIS — S4991XA Unspecified injury of right shoulder and upper arm, initial encounter: Secondary | ICD-10-CM | POA: Diagnosis not present

## 2020-11-17 LAB — CBC WITH DIFFERENTIAL/PLATELET
Abs Immature Granulocytes: 0.03 10*3/uL (ref 0.00–0.07)
Basophils Absolute: 0 10*3/uL (ref 0.0–0.1)
Basophils Relative: 0 %
Eosinophils Absolute: 0 10*3/uL (ref 0.0–0.5)
Eosinophils Relative: 0 %
HCT: 40.2 % (ref 36.0–46.0)
Hemoglobin: 12.9 g/dL (ref 12.0–15.0)
Immature Granulocytes: 0 %
Lymphocytes Relative: 13 %
Lymphs Abs: 1.6 10*3/uL (ref 0.7–4.0)
MCH: 26.3 pg (ref 26.0–34.0)
MCHC: 32.1 g/dL (ref 30.0–36.0)
MCV: 81.9 fL (ref 80.0–100.0)
Monocytes Absolute: 0.8 10*3/uL (ref 0.1–1.0)
Monocytes Relative: 7 %
Neutro Abs: 9.9 10*3/uL — ABNORMAL HIGH (ref 1.7–7.7)
Neutrophils Relative %: 80 %
Platelets: 311 10*3/uL (ref 150–400)
RBC: 4.91 MIL/uL (ref 3.87–5.11)
RDW: 15.5 % (ref 11.5–15.5)
WBC: 12.4 10*3/uL — ABNORMAL HIGH (ref 4.0–10.5)
nRBC: 0 % (ref 0.0–0.2)

## 2020-11-17 LAB — BASIC METABOLIC PANEL
Anion gap: 12 (ref 5–15)
BUN: 32 mg/dL — ABNORMAL HIGH (ref 8–23)
CO2: 26 mmol/L (ref 22–32)
Calcium: 9.6 mg/dL (ref 8.9–10.3)
Chloride: 106 mmol/L (ref 98–111)
Creatinine, Ser: 0.82 mg/dL (ref 0.44–1.00)
GFR, Estimated: >60 mL/min (ref >60)
Glucose, Bld: 108 mg/dL — ABNORMAL HIGH (ref 70–99)
Potassium: 3.7 mmol/L (ref 3.5–5.1)
Sodium: 144 mmol/L (ref 135–145)

## 2020-11-17 MED ORDER — IOHEXOL 300 MG/ML  SOLN
100.0000 mL | Freq: Once | INTRAMUSCULAR | Status: AC | PRN
  Administered 2020-11-17: 80 mL via INTRAVENOUS

## 2020-11-17 MED ORDER — ACETAMINOPHEN 500 MG PO TABS
1000.0000 mg | ORAL_TABLET | Freq: Once | ORAL | Status: AC
  Administered 2020-11-17: 1000 mg via ORAL
  Filled 2020-11-17: qty 2

## 2020-11-17 NOTE — ED Provider Notes (Signed)
[facility] EMERGENCY DEPARTMENT Provider Note   CSN: [id number] Arrival date & time: 11/17/20  1533     History Chief Complaint  Patient presents with  . Fall    HPI   Blood pressure (!) 169/82, pulse 81, temperature 98.2 F (36.8 C), temperature source Oral, resp. rate 18, height 5\' 6"  (1.676 m), weight 79.4 kg, SpO2 97 %.  Beautifull Cisar is a 79 y.o. female complaining of headache neck pain knee and shoulder pain status post fall prior to arrival.  Patient states she was at the grocery store, pushing a cart and the wheel got stuck the cart twisted and she fell on her right side.  She is not anticoagulated, she hit her head.  She does not think she lost consciousness but that she was just stunned.  She has a mild global headache with no change in vision, dysarthria, ataxia.  Reporting mild neck pain with no numbness or weakness no chest pain abdominal pain hip pain or difficulty moving major joints.     Past Medical History:  Diagnosis Date  . Hypertension     Patient Active Problem List   Diagnosis Date Noted  . S/P left THA, AA 05/08/2019  . Osteoarthritis of left hip 05/08/2019    Past Surgical History:  Procedure Laterality Date  . ABDOMINAL HYSTERECTOMY    . APPENDECTOMY    . TONSILLECTOMY    . TOTAL HIP ARTHROPLASTY Left 05/08/2019   Procedure: TOTAL HIP ARTHROPLASTY ANTERIOR APPROACH;  Surgeon: 05/10/2019, MD;  Location: [facility];  Service: Orthopedics;  Laterality: Left;     OB History   No obstetric history on file.     No family history on file.  Social History   Tobacco Use  . Smoking status: Former Smoker    Quit date: 2000    Years since quitting: 22.0  . Smokeless tobacco: Never Used  Vaping Use  . Vaping Use: Never used  Substance Use Topics  . Alcohol use: Not Currently  . Drug use: Not Currently    Home Medications Prior to Admission medications   Medication Sig Start Date End Date Taking? Authorizing Provider   amLODipine-benazepril (LOTREL) 5-20 MG capsule Take 1 capsule by mouth daily.    [provider]  atorvastatin (LIPITOR) 40 MG tablet Take 40 mg by mouth daily.    [provider]  cetirizine (ZYRTEC) 10 MG tablet Take 10 mg by mouth daily as needed for allergies.    [provider]  docusate sodium (COLACE) 100 MG capsule Take 1 capsule (100 mg total) by mouth 2 (two) times daily. 05/09/19   05/11/19, PA-C  ferrous sulfate (FERROUSUL) 325 (65 FE) MG tablet Take 1 tablet (325 mg total) by mouth 3 (three) times daily with meals. 05/09/19   05/11/19, PA-C  hydrochlorothiazide (HYDRODIURIL) 25 MG tablet Take 25 mg by mouth daily.    [provider]  HYDROcodone-acetaminophen (NORCO) 7.5-325 MG tablet Take 1-2 tablets by mouth every 4 (four) hours as needed for moderate pain. 05/09/19   05/11/19, PA-C  methocarbamol (ROBAXIN) 500 MG tablet Take 1 tablet (500 mg total) by mouth every 6 (six) hours as needed for muscle spasms. 05/09/19   05/11/19, PA-C  omeprazole (PRILOSEC) 40 MG capsule Take 40 mg by mouth daily.    [provider]  polyethylene glycol (MIRALAX / GLYCOLAX) 17 g packet Take 17 g by mouth 2 (two) times daily. 05/09/19   05/11/19, PA-C  Allergies    Penicillins  Review of Systems   Review of Systems  A complete review of systems was obtained and all systems are negative except as noted in the HPI and PMH.    Physical Exam Updated Vital Signs BP (!) 169/82 (BP Location: Left Arm)   Pulse 81   Temp 98.2 F (36.8 C) (Oral)   Resp 18   Ht 5\' 6"  (1.676 m)   Wt 79.4 kg   SpO2 97%   BMI 28.25 kg/m   Physical Exam Vitals and nursing note reviewed.  Constitutional:      Appearance: She is well-developed and well-nourished.  HENT:     Head: Normocephalic and atraumatic.     Mouth/Throat:     Mouth: Oropharynx is clear and moist.      Comments: No abrasions or contusions.   No hemotympanum,  battle signs or raccoon's eyes  No crepitance or tenderness to palpation along the orbital rim.  EOMI intact with no pain or diplopia  No abnormal otorrhea or rhinorrhea. Nasal septum midline.  No intraoral trauma.Eyes:     Extraocular Movements: EOM normal.     Conjunctiva/sclera: Conjunctivae normal.     Pupils: Pupils are equal, round, and reactive to light.  Neck:     Comments: That of midline C-spine tenderness to palpation with no step-offs appreciated, good range of motion. Grip/bicep/tricep strength 5/5 bilaterally. Able to differentiate between pinprick and light touch bilaterally    Cardiovascular:     Rate and Rhythm: Normal rate and regular rhythm.     Pulses: Intact distal pulses.  Pulmonary:     Effort: Pulmonary effort is normal. No respiratory distress.     Breath sounds: Normal breath sounds. No wheezing or rales.     Comments: No seatbelt sign, TTP or crepitance Chest:     Chest wall: No tenderness.  Abdominal:     General: Bowel sounds are normal. There is no distension.     Palpations: Abdomen is soft. There is no mass.     Tenderness: There is no abdominal tenderness. There is no guarding or rebound.     Comments: No Seatbelt Sign  Musculoskeletal:        General: No tenderness or edema. Normal range of motion.     Cervical back: Normal range of motion and neck supple.     Comments: Right shoulder: Shoulder with no deformity. FROM to shoulder and elbow. No TTP of rotator cuff musculature. Drop arm negative. Neurovascularly intact  Right knee: No deformity, erythema or abrasions. FROM. No effusion or crepitance. Anterior and posterior drawer show no abnormal laxity. Stable to valgus and varus stress. Joint lines are non-tender. Neurovascularly intact. Pt ambulates with non-antalgic gait.    Pelvis stable, No TTP of greater trochanter bilaterally  No tenderness to percussion of Lumbar/Thoracic spinous processes. No step-offs. No paraspinal muscular TTP   Skin:    General: Skin is warm.  Neurological:     Mental Status: She is alert and oriented to person, place, and time.     Coordination: Abnormal coordination: .npmdm.     Comments: Strength 5/5 x4 extremities   Distal sensation intact  Psychiatric:        Mood and Affect: Mood and affect normal.     ED Results / Procedures / Treatments   Labs (all labs ordered are listed, but only abnormal results are displayed) Labs Reviewed - No data to display  EKG None  Radiology DG Shoulder Right  Result  Date: 11/17/2020 CLINICAL DATA:  Fall striking right shoulder today at [employer]. Pain posterior right shoulder. EXAM: RIGHT SHOULDER - 2+ VIEW COMPARISON:  None. FINDINGS: There is no evidence of fracture or dislocation. Minor acromioclavicular degenerative spurring. Glenohumeral joint not well assessed on provided views. Soft tissues are unremarkable. No abnormality in the included portion of the thorax. IMPRESSION: No fracture or subluxation of the right shoulder. Electronically Signed   By: Keith Rake M.D.   On: 11/17/2020 16:19   DG Knee Complete 4 Views Right  Result Date: 11/17/2020 CLINICAL DATA:  Fall today at [employer].  Right knee pain. EXAM: RIGHT KNEE - COMPLETE 4+ VIEW COMPARISON:  None. FINDINGS: No fracture or dislocation. Tricompartmental osteoarthritis with moderate peripheral spurring. There is mild lateral tibiofemoral joint space narrowing. Spurring of the tibial spines. Small joint effusion without lipohemarthrosis. No focal soft tissue abnormality. IMPRESSION: 1. No fracture or dislocation of the right knee. 2. Mild to moderate osteoarthritis. Electronically Signed   By: Keith Rake M.D.   On: 11/17/2020 16:21    Procedures Procedures (including critical care time)  Medications Ordered in ED Medications  acetaminophen (TYLENOL) tablet 1,000 mg (1,000 mg Oral Given 11/17/20 2020)    ED Course  I have reviewed the triage vital signs and the nursing  notes.  Pertinent labs & imaging results that were available during my care of the patient were reviewed by me and considered in my medical decision making (see chart for details).    MDM Rules/Calculators/A&P                          Vitals:   11/17/20 1545 11/17/20 1546 11/17/20 1953  BP:  (!) 171/89 (!) 169/82  Pulse:  89 81  Resp:  18 18  Temp:  98.5 F (36.9 C) 98.2 F (36.8 C)  TempSrc:  Oral Oral  SpO2:  97% 97%  Weight: 79.4 kg    Height: 5\' 6"  (1.676 m)      Medications  acetaminophen (TYLENOL) tablet 1,000 mg (1,000 mg Oral Given 11/17/20 2020)    Cala Cabera is 79 y.o. female senting for evaluation status post fall from standing.  There was head trauma, she does not believe she lost consciousness.  She also has some midline neck pain.  Patient is complaining of right knee and right shoulder plain, triage initiated x-rays are negative, reassuring physical exam, doubt any significant thoracic, abdominal or hip,.  Will obtain CT of head and neck, counseled her on concussion precautions   Final Clinical Impression(s) / ED Diagnoses Final diagnoses:  Concussion without loss of consciousness, initial encounter  Musculoskeletal pain    Rx / DC Orders ED Discharge Orders    None       Suhailah Kwan, Charna Elizabeth 11/17/20 2023    Ezequiel Essex, MD 11/17/20 2241

## 2020-11-17 NOTE — ED Notes (Signed)
Patient transported to CT 

## 2020-11-17 NOTE — ED Triage Notes (Signed)
C/o fall x 2 hrs ago , c/o right shoulder and right knee pain

## 2020-11-17 NOTE — ED Provider Notes (Signed)
Patient with history of hypertension here with headache, neck pain, right shoulder knee pain after a fall.  The cart at the grocery store became stuck and she fell onto the right side hitting her head and right shoulder.  Did not lose consciousness.  No blood thinner use.  CT head and C-spine showing a mass of her left oropharynx which patient is not aware of. This is visible on direct inspection.  She is swallowing her secretions denies any throat pain or odynophagia.  Full motion of right shoulder right knee without pain  CT soft tissue neck as recommended by radiology  CT neck is concerning for malignant lymphadenopathy of the left side of the throat.  Patient has no odynophagia or dysphagia.  No difficulty with secretions.  Her white blood cell count is normal. Discussed with Dr. Annalee Genta of ENT.  He will be happy to evaluate in the office and biopsy.  Does not think she needs further imaging at this time. Discussed with patient and her sister that this is concerning and may represent malignancy Discussed need for close follow-up with ENT and likely biopsy   Glynn Octave, MD 11/17/20 2238

## 2020-11-17 NOTE — ED Notes (Signed)
Called x 2. No answer 

## 2020-11-17 NOTE — Discharge Instructions (Addendum)
The x-rays and CT scan showed no major injury from the fall.  As we discussed there is a concerning mass to the left side of your throat that needs follow-up by Dr. Annalee Genta the ear nose and throat doctor.  Call tomorrow for an appointment to be seen to have a biopsy of this area.  Do not participate in any sports or any activities that could result in head trauma until you are cleared by your primary care physician or neurologist.   Take Tylenol for pain control at home.   Please follow with your primary care doctor in the next 2 days for a check-up. They must obtain records for further management.   Do not hesitate to return to the Emergency Department for any new, worsening or concerning symptoms.

## 2020-11-23 DIAGNOSIS — I10 Essential (primary) hypertension: Secondary | ICD-10-CM | POA: Diagnosis not present

## 2020-11-23 DIAGNOSIS — W19XXXD Unspecified fall, subsequent encounter: Secondary | ICD-10-CM | POA: Diagnosis not present

## 2020-11-23 DIAGNOSIS — M62830 Muscle spasm of back: Secondary | ICD-10-CM | POA: Diagnosis not present

## 2020-11-23 DIAGNOSIS — R221 Localized swelling, mass and lump, neck: Secondary | ICD-10-CM | POA: Diagnosis not present

## 2020-11-23 DIAGNOSIS — F0280 Dementia in other diseases classified elsewhere without behavioral disturbance: Secondary | ICD-10-CM | POA: Diagnosis not present

## 2020-11-23 DIAGNOSIS — K219 Gastro-esophageal reflux disease without esophagitis: Secondary | ICD-10-CM | POA: Diagnosis not present

## 2020-11-23 DIAGNOSIS — E7800 Pure hypercholesterolemia, unspecified: Secondary | ICD-10-CM | POA: Diagnosis not present

## 2020-11-23 DIAGNOSIS — G301 Alzheimer's disease with late onset: Secondary | ICD-10-CM | POA: Diagnosis not present

## 2020-11-26 DIAGNOSIS — M545 Low back pain, unspecified: Secondary | ICD-10-CM | POA: Diagnosis not present

## 2020-11-26 DIAGNOSIS — F0280 Dementia in other diseases classified elsewhere without behavioral disturbance: Secondary | ICD-10-CM | POA: Diagnosis not present

## 2020-11-26 DIAGNOSIS — M25511 Pain in right shoulder: Secondary | ICD-10-CM | POA: Diagnosis not present

## 2020-11-26 DIAGNOSIS — M25551 Pain in right hip: Secondary | ICD-10-CM | POA: Diagnosis not present

## 2020-11-26 DIAGNOSIS — E7800 Pure hypercholesterolemia, unspecified: Secondary | ICD-10-CM | POA: Diagnosis not present

## 2020-11-26 DIAGNOSIS — G301 Alzheimer's disease with late onset: Secondary | ICD-10-CM | POA: Diagnosis not present

## 2020-11-26 DIAGNOSIS — J392 Other diseases of pharynx: Secondary | ICD-10-CM | POA: Diagnosis not present

## 2020-11-26 DIAGNOSIS — I10 Essential (primary) hypertension: Secondary | ICD-10-CM | POA: Diagnosis not present

## 2020-11-26 DIAGNOSIS — K219 Gastro-esophageal reflux disease without esophagitis: Secondary | ICD-10-CM | POA: Diagnosis not present

## 2020-11-29 ENCOUNTER — Other Ambulatory Visit: Payer: Self-pay

## 2020-11-29 ENCOUNTER — Emergency Department (HOSPITAL_BASED_OUTPATIENT_CLINIC_OR_DEPARTMENT_OTHER): Payer: Medicare HMO

## 2020-11-29 DIAGNOSIS — Z79899 Other long term (current) drug therapy: Secondary | ICD-10-CM | POA: Insufficient documentation

## 2020-11-29 DIAGNOSIS — K219 Gastro-esophageal reflux disease without esophagitis: Secondary | ICD-10-CM | POA: Diagnosis not present

## 2020-11-29 DIAGNOSIS — M545 Low back pain, unspecified: Secondary | ICD-10-CM | POA: Diagnosis not present

## 2020-11-29 DIAGNOSIS — J392 Other diseases of pharynx: Secondary | ICD-10-CM | POA: Diagnosis not present

## 2020-11-29 DIAGNOSIS — G301 Alzheimer's disease with late onset: Secondary | ICD-10-CM | POA: Diagnosis not present

## 2020-11-29 DIAGNOSIS — M5459 Other low back pain: Secondary | ICD-10-CM | POA: Diagnosis not present

## 2020-11-29 DIAGNOSIS — W1830XA Fall on same level, unspecified, initial encounter: Secondary | ICD-10-CM | POA: Diagnosis not present

## 2020-11-29 DIAGNOSIS — R0781 Pleurodynia: Secondary | ICD-10-CM | POA: Insufficient documentation

## 2020-11-29 DIAGNOSIS — F0280 Dementia in other diseases classified elsewhere without behavioral disturbance: Secondary | ICD-10-CM | POA: Diagnosis not present

## 2020-11-29 DIAGNOSIS — I10 Essential (primary) hypertension: Secondary | ICD-10-CM | POA: Insufficient documentation

## 2020-11-29 DIAGNOSIS — M25551 Pain in right hip: Secondary | ICD-10-CM | POA: Diagnosis not present

## 2020-11-29 DIAGNOSIS — M25511 Pain in right shoulder: Secondary | ICD-10-CM | POA: Diagnosis not present

## 2020-11-29 DIAGNOSIS — E7800 Pure hypercholesterolemia, unspecified: Secondary | ICD-10-CM | POA: Diagnosis not present

## 2020-11-29 MED ORDER — LIDOCAINE 5 % EX PTCH
2.0000 | MEDICATED_PATCH | CUTANEOUS | Status: DC
  Administered 2020-11-30: 2 via TRANSDERMAL
  Filled 2020-11-29: qty 2

## 2020-11-29 NOTE — ED Triage Notes (Signed)
Pt states she fell in grocery store on 12/29. Came here and had several Xrays/CTs done, all were negative. Now complaining of left sided back tightness/ pain near ribs. Pain to palpation and with movements. Pain to right knee as well.

## 2020-11-30 ENCOUNTER — Emergency Department (HOSPITAL_BASED_OUTPATIENT_CLINIC_OR_DEPARTMENT_OTHER)
Admission: EM | Admit: 2020-11-30 | Discharge: 2020-11-30 | Disposition: A | Discharge status: Home / Self Care | Payer: Medicare HMO | Attending: Emergency Medicine | Admitting: Emergency Medicine

## 2020-11-30 ENCOUNTER — Encounter (HOSPITAL_BASED_OUTPATIENT_CLINIC_OR_DEPARTMENT_OTHER): Payer: Self-pay | Admitting: Emergency Medicine

## 2020-11-30 DIAGNOSIS — W19XXXS Unspecified fall, sequela: Secondary | ICD-10-CM

## 2020-11-30 MED ORDER — LIDOCAINE 5 % EX PTCH: 1.0000 | MEDICATED_PATCH | CUTANEOUS | 0 refills | Status: DC

## 2020-11-30 MED ORDER — ACETAMINOPHEN 500 MG PO TABS
1000.0000 mg | ORAL_TABLET | Freq: Once | ORAL | Status: AC
  Administered 2020-11-30: 1000 mg via ORAL
  Filled 2020-11-30: qty 2

## 2020-11-30 NOTE — ED Provider Notes (Signed)
[facility] EMERGENCY DEPARTMENT Provider Note   CSN: [id number] Arrival date & time: 11/29/20  1948     History Chief Complaint  Patient presents with  . Back Pain    Danielle Tate is a 80 y.o. female.  The history is provided by the patient.  Illness Location:  Ribs Quality:  Pain Severity:  Mild Onset quality:  Gradual Duration:  2 weeks Timing:  Constant Progression:  Unchanged Chronicity:  New Context:  Since fall Relieved by:  Nothing Worsened by:  Movement and palpation  Ineffective treatments:  None Associated symptoms: no abdominal pain, no chest pain, no congestion, no cough, no diarrhea, no ear pain, no fatigue, no fever, no headaches, no loss of consciousness, no myalgias, no nausea, no rash, no rhinorrhea, no shortness of breath, no sore throat, no vomiting and no wheezing        Past Medical History:  Diagnosis Date  . Hypertension     Patient Active Problem List   Diagnosis Date Noted  . S/P left THA, AA 05/08/2019  . Osteoarthritis of left hip 05/08/2019    Past Surgical History:  Procedure Laterality Date  . ABDOMINAL HYSTERECTOMY    . APPENDECTOMY    . TONSILLECTOMY    . TOTAL HIP ARTHROPLASTY Left 05/08/2019   Procedure: TOTAL HIP ARTHROPLASTY ANTERIOR APPROACH;  Surgeon: Paralee Cancel, MD;  Location: [facility];  Service: Orthopedics;  Laterality: Left;     OB History   No obstetric history on file.     History reviewed. No pertinent family history.  Social History   Tobacco Use  . Smoking status: Former Smoker    Quit date: 2000    Years since quitting: 22.0  . Smokeless tobacco: Never Used  Vaping Use  . Vaping Use: Never used  Substance Use Topics  . Alcohol use: Not Currently  . Drug use: Not Currently    Home Medications Prior to Admission medications   Medication Sig Start Date End Date Taking? Authorizing Provider  amLODipine-benazepril (LOTREL) 5-20 MG capsule Take 1 capsule by mouth daily.    [provider]  atorvastatin (LIPITOR) 40 MG tablet Take 40 mg by mouth daily.    [provider]  cetirizine (ZYRTEC) 10 MG tablet Take 10 mg by mouth daily as needed for allergies.    [provider]  docusate sodium (COLACE) 100 MG capsule Take 1 capsule (100 mg total) by mouth 2 (two) times daily. 05/09/19   Danae Orleans, PA-C  ferrous sulfate (FERROUSUL) 325 (65 FE) MG tablet Take 1 tablet (325 mg total) by mouth 3 (three) times daily with meals. 05/09/19   Danae Orleans, PA-C  hydrochlorothiazide (HYDRODIURIL) 25 MG tablet Take 25 mg by mouth daily.    [provider]  HYDROcodone-acetaminophen (NORCO) 7.5-325 MG tablet Take 1-2 tablets by mouth every 4 (four) hours as needed for moderate pain. 05/09/19   Danae Orleans, PA-C  methocarbamol (ROBAXIN) 500 MG tablet Take 1 tablet (500 mg total) by mouth every 6 (six) hours as needed for muscle spasms. 05/09/19   Danae Orleans, PA-C  omeprazole (PRILOSEC) 40 MG capsule Take 40 mg by mouth daily.    [provider]  polyethylene glycol (MIRALAX / GLYCOLAX) 17 g packet Take 17 g by mouth 2 (two) times daily. 05/09/19   Danae Orleans, PA-C    Allergies    Penicillins  Review of Systems   Review of Systems  Constitutional: Negative for fatigue and fever.  HENT: Negative for  congestion, ear pain, rhinorrhea and sore throat.   Respiratory: Negative for cough, shortness of breath and wheezing.   Cardiovascular: Negative for chest pain, palpitations and leg swelling.  Gastrointestinal: Negative for abdominal pain, diarrhea, nausea and vomiting.  Endocrine: Negative for polyuria.  Genitourinary: Negative for difficulty urinating.  Musculoskeletal: Negative for myalgias.  Skin: Negative for rash.  Neurological: Negative for loss of consciousness, weakness, numbness and headaches.  Psychiatric/Behavioral: Negative for agitation.  All other systems reviewed and are negative.   Physical Exam Updated  Vital Signs BP (!) 168/133 (BP Location: Right Arm)   Pulse 80   Temp 98.2 F (36.8 C) (Oral)   Resp 20   Ht 5\' 3"  (1.6 m)   Wt 70.3 kg   SpO2 100%   BMI 27.46 kg/m   Physical Exam Vitals and nursing note reviewed.  Constitutional:      General: She is not in acute distress.    Appearance: Normal appearance.  HENT:     Head: Normocephalic and atraumatic.     Nose: Nose normal.  Eyes:     Conjunctiva/sclera: Conjunctivae normal.     Pupils: Pupils are equal, round, and reactive to light.  Cardiovascular:     Rate and Rhythm: Normal rate and regular rhythm.     Pulses: Normal pulses.     Heart sounds: Normal heart sounds.  Pulmonary:     Effort: Pulmonary effort is normal.     Breath sounds: Normal breath sounds.  Abdominal:     General: Abdomen is flat. Bowel sounds are normal.     Palpations: Abdomen is soft.     Tenderness: There is no abdominal tenderness. There is no guarding or rebound.  Musculoskeletal:        General: No tenderness. Normal range of motion.     Cervical back: Normal range of motion and neck supple.     Right knee: Normal.     Instability Tests: Anterior drawer test negative. Posterior drawer test negative. Anterior Lachman test negative. Medial McMurray test negative and lateral McMurray test negative.     Left knee: Normal.     Instability Tests: Anterior drawer test negative. Posterior drawer test negative. Anterior Lachman test negative. Medial McMurray test negative and lateral McMurray test negative.     Right lower leg: Normal. No edema.     Left lower leg: Normal. No edema.  Skin:    General: Skin is warm and dry.     Capillary Refill: Capillary refill takes less than 2 seconds.  Neurological:     General: No focal deficit present.     Mental Status: She is alert and oriented to person, place, and time.     Deep Tendon Reflexes: Reflexes normal.  Psychiatric:        Mood and Affect: Mood normal.        Behavior: Behavior normal.      ED Results / Procedures / Treatments   Labs (all labs ordered are listed, but only abnormal results are displayed) Labs Reviewed - No data to display  EKG None  Radiology DG Ribs Unilateral W/Chest Left  Result Date: 11/29/2020 CLINICAL DATA:  Fall on 11/17/2020 persistent left axillary pain. EXAM: LEFT RIBS AND CHEST - 3+ VIEW COMPARISON:  Chest radiograph November 17, 2020 the in the in FINDINGS: No fracture or other bone lesions are seen involving the ribs. There is no evidence of pneumothorax or pleural effusion. Both lungs are clear. Heart size and mediastinal contours are within  normal limits. IMPRESSION: No visible displaced rib fracture. Electronically Signed   By: Dahlia Bailiff MD   On: 11/29/2020 20:34    Procedures Procedures (including critical care time)  Medications Ordered in ED Medications  lidocaine (LIDODERM) 5 % 2 patch (has no administration in time range)  acetaminophen (TYLENOL) tablet 1,000 mg (has no administration in time range)    ED Course  I have reviewed the triage vital signs and the nursing notes.  Pertinent labs & imaging results that were available during my care of the patient were reviewed by me and considered in my medical decision making (see chart for details).    No crepitance or deformity.  No SOB no CP.  No swelling.  Alternate tylenol and ibuprofen will add lidoderm.  Follow up with your PMD for ongoing care.   Final Clinical Impression(s) / ED Diagnoses Final diagnoses:  None   Return for intractable cough, coughing up blood,fevers >100.4 unrelieved by medication, shortness of breath, intractable vomiting, chest pain, shortness of breath, weakness,numbness, changes in speech, facial asymmetry,abdominal pain, passing out,Inability to tolerate liquids or food, cough, altered mental status or any concerns. No signs of systemic illness or infection. The patient is nontoxic-appearing on exam and vital signs are within normal  limits.   I have reviewed the triage vital signs and the nursing notes. Pertinent labs &imaging results that were available during my care of the patient were reviewed by me and considered in my medical decision making (see chart for details).After history, exam, and medical workup I feel the patient has beenappropriately medically screened and is safe for discharge home. Pertinent diagnoses were discussed with the patient. Patient was given return precautions.   Rx / DC Orders ED Discharge Orders    None       Mava Suares, MD 11/30/20 0020

## 2020-12-02 DIAGNOSIS — J392 Other diseases of pharynx: Secondary | ICD-10-CM | POA: Diagnosis not present

## 2020-12-02 DIAGNOSIS — F0280 Dementia in other diseases classified elsewhere without behavioral disturbance: Secondary | ICD-10-CM | POA: Diagnosis not present

## 2020-12-02 DIAGNOSIS — G301 Alzheimer's disease with late onset: Secondary | ICD-10-CM | POA: Diagnosis not present

## 2020-12-02 DIAGNOSIS — M545 Low back pain, unspecified: Secondary | ICD-10-CM | POA: Diagnosis not present

## 2020-12-02 DIAGNOSIS — M25551 Pain in right hip: Secondary | ICD-10-CM | POA: Diagnosis not present

## 2020-12-02 DIAGNOSIS — M25511 Pain in right shoulder: Secondary | ICD-10-CM | POA: Diagnosis not present

## 2020-12-02 DIAGNOSIS — E7800 Pure hypercholesterolemia, unspecified: Secondary | ICD-10-CM | POA: Diagnosis not present

## 2020-12-02 DIAGNOSIS — I10 Essential (primary) hypertension: Secondary | ICD-10-CM | POA: Diagnosis not present

## 2020-12-02 DIAGNOSIS — K219 Gastro-esophageal reflux disease without esophagitis: Secondary | ICD-10-CM | POA: Diagnosis not present

## 2020-12-03 DIAGNOSIS — J392 Other diseases of pharynx: Secondary | ICD-10-CM | POA: Diagnosis not present

## 2020-12-03 DIAGNOSIS — F0280 Dementia in other diseases classified elsewhere without behavioral disturbance: Secondary | ICD-10-CM | POA: Diagnosis not present

## 2020-12-03 DIAGNOSIS — I10 Essential (primary) hypertension: Secondary | ICD-10-CM | POA: Diagnosis not present

## 2020-12-03 DIAGNOSIS — M25551 Pain in right hip: Secondary | ICD-10-CM | POA: Diagnosis not present

## 2020-12-03 DIAGNOSIS — K219 Gastro-esophageal reflux disease without esophagitis: Secondary | ICD-10-CM | POA: Diagnosis not present

## 2020-12-03 DIAGNOSIS — E7800 Pure hypercholesterolemia, unspecified: Secondary | ICD-10-CM | POA: Diagnosis not present

## 2020-12-03 DIAGNOSIS — G301 Alzheimer's disease with late onset: Secondary | ICD-10-CM | POA: Diagnosis not present

## 2020-12-03 DIAGNOSIS — M25511 Pain in right shoulder: Secondary | ICD-10-CM | POA: Diagnosis not present

## 2020-12-03 DIAGNOSIS — M545 Low back pain, unspecified: Secondary | ICD-10-CM | POA: Diagnosis not present

## 2020-12-08 DIAGNOSIS — J392 Other diseases of pharynx: Secondary | ICD-10-CM | POA: Diagnosis not present

## 2020-12-08 DIAGNOSIS — Z993 Dependence on wheelchair: Secondary | ICD-10-CM | POA: Diagnosis not present

## 2020-12-08 DIAGNOSIS — R221 Localized swelling, mass and lump, neck: Secondary | ICD-10-CM | POA: Diagnosis not present

## 2020-12-10 DIAGNOSIS — M25511 Pain in right shoulder: Secondary | ICD-10-CM | POA: Diagnosis not present

## 2020-12-10 DIAGNOSIS — E7800 Pure hypercholesterolemia, unspecified: Secondary | ICD-10-CM | POA: Diagnosis not present

## 2020-12-10 DIAGNOSIS — M545 Low back pain, unspecified: Secondary | ICD-10-CM | POA: Diagnosis not present

## 2020-12-10 DIAGNOSIS — G301 Alzheimer's disease with late onset: Secondary | ICD-10-CM | POA: Diagnosis not present

## 2020-12-10 DIAGNOSIS — K219 Gastro-esophageal reflux disease without esophagitis: Secondary | ICD-10-CM | POA: Diagnosis not present

## 2020-12-10 DIAGNOSIS — J392 Other diseases of pharynx: Secondary | ICD-10-CM | POA: Diagnosis not present

## 2020-12-10 DIAGNOSIS — F0280 Dementia in other diseases classified elsewhere without behavioral disturbance: Secondary | ICD-10-CM | POA: Diagnosis not present

## 2020-12-10 DIAGNOSIS — I10 Essential (primary) hypertension: Secondary | ICD-10-CM | POA: Diagnosis not present

## 2020-12-10 DIAGNOSIS — M25551 Pain in right hip: Secondary | ICD-10-CM | POA: Diagnosis not present

## 2020-12-15 DIAGNOSIS — E7800 Pure hypercholesterolemia, unspecified: Secondary | ICD-10-CM | POA: Diagnosis not present

## 2020-12-15 DIAGNOSIS — K219 Gastro-esophageal reflux disease without esophagitis: Secondary | ICD-10-CM | POA: Diagnosis not present

## 2020-12-15 DIAGNOSIS — F0280 Dementia in other diseases classified elsewhere without behavioral disturbance: Secondary | ICD-10-CM | POA: Diagnosis not present

## 2020-12-15 DIAGNOSIS — M545 Low back pain, unspecified: Secondary | ICD-10-CM | POA: Diagnosis not present

## 2020-12-15 DIAGNOSIS — M25551 Pain in right hip: Secondary | ICD-10-CM | POA: Diagnosis not present

## 2020-12-15 DIAGNOSIS — M25511 Pain in right shoulder: Secondary | ICD-10-CM | POA: Diagnosis not present

## 2020-12-15 DIAGNOSIS — G301 Alzheimer's disease with late onset: Secondary | ICD-10-CM | POA: Diagnosis not present

## 2020-12-15 DIAGNOSIS — I10 Essential (primary) hypertension: Secondary | ICD-10-CM | POA: Diagnosis not present

## 2020-12-15 DIAGNOSIS — J392 Other diseases of pharynx: Secondary | ICD-10-CM | POA: Diagnosis not present

## 2020-12-16 DIAGNOSIS — R52 Pain, unspecified: Secondary | ICD-10-CM | POA: Diagnosis not present

## 2020-12-16 DIAGNOSIS — G8929 Other chronic pain: Secondary | ICD-10-CM | POA: Diagnosis not present

## 2020-12-16 DIAGNOSIS — M25551 Pain in right hip: Secondary | ICD-10-CM | POA: Diagnosis not present

## 2020-12-16 DIAGNOSIS — M25561 Pain in right knee: Secondary | ICD-10-CM | POA: Diagnosis not present

## 2020-12-20 DIAGNOSIS — M25551 Pain in right hip: Secondary | ICD-10-CM | POA: Diagnosis not present

## 2020-12-20 DIAGNOSIS — E7800 Pure hypercholesterolemia, unspecified: Secondary | ICD-10-CM | POA: Diagnosis not present

## 2020-12-20 DIAGNOSIS — G301 Alzheimer's disease with late onset: Secondary | ICD-10-CM | POA: Diagnosis not present

## 2020-12-20 DIAGNOSIS — M545 Low back pain, unspecified: Secondary | ICD-10-CM | POA: Diagnosis not present

## 2020-12-20 DIAGNOSIS — F0280 Dementia in other diseases classified elsewhere without behavioral disturbance: Secondary | ICD-10-CM | POA: Diagnosis not present

## 2020-12-20 DIAGNOSIS — K219 Gastro-esophageal reflux disease without esophagitis: Secondary | ICD-10-CM | POA: Diagnosis not present

## 2020-12-20 DIAGNOSIS — I10 Essential (primary) hypertension: Secondary | ICD-10-CM | POA: Diagnosis not present

## 2020-12-20 DIAGNOSIS — M25511 Pain in right shoulder: Secondary | ICD-10-CM | POA: Diagnosis not present

## 2020-12-20 DIAGNOSIS — J392 Other diseases of pharynx: Secondary | ICD-10-CM | POA: Diagnosis not present

## 2020-12-21 DIAGNOSIS — G301 Alzheimer's disease with late onset: Secondary | ICD-10-CM | POA: Diagnosis not present

## 2020-12-21 DIAGNOSIS — M25551 Pain in right hip: Secondary | ICD-10-CM | POA: Diagnosis not present

## 2020-12-21 DIAGNOSIS — J392 Other diseases of pharynx: Secondary | ICD-10-CM | POA: Diagnosis not present

## 2020-12-21 DIAGNOSIS — I10 Essential (primary) hypertension: Secondary | ICD-10-CM | POA: Diagnosis not present

## 2020-12-21 DIAGNOSIS — M545 Low back pain, unspecified: Secondary | ICD-10-CM | POA: Diagnosis not present

## 2020-12-21 DIAGNOSIS — F0280 Dementia in other diseases classified elsewhere without behavioral disturbance: Secondary | ICD-10-CM | POA: Diagnosis not present

## 2020-12-21 DIAGNOSIS — K219 Gastro-esophageal reflux disease without esophagitis: Secondary | ICD-10-CM | POA: Diagnosis not present

## 2020-12-21 DIAGNOSIS — E7800 Pure hypercholesterolemia, unspecified: Secondary | ICD-10-CM | POA: Diagnosis not present

## 2020-12-21 DIAGNOSIS — M25511 Pain in right shoulder: Secondary | ICD-10-CM | POA: Diagnosis not present

## 2020-12-23 DIAGNOSIS — M25551 Pain in right hip: Secondary | ICD-10-CM | POA: Diagnosis not present

## 2020-12-23 DIAGNOSIS — M25511 Pain in right shoulder: Secondary | ICD-10-CM | POA: Diagnosis not present

## 2020-12-23 DIAGNOSIS — K219 Gastro-esophageal reflux disease without esophagitis: Secondary | ICD-10-CM | POA: Diagnosis not present

## 2020-12-23 DIAGNOSIS — M545 Low back pain, unspecified: Secondary | ICD-10-CM | POA: Diagnosis not present

## 2020-12-23 DIAGNOSIS — J392 Other diseases of pharynx: Secondary | ICD-10-CM | POA: Diagnosis not present

## 2020-12-23 DIAGNOSIS — G301 Alzheimer's disease with late onset: Secondary | ICD-10-CM | POA: Diagnosis not present

## 2020-12-23 DIAGNOSIS — I10 Essential (primary) hypertension: Secondary | ICD-10-CM | POA: Diagnosis not present

## 2020-12-23 DIAGNOSIS — E7800 Pure hypercholesterolemia, unspecified: Secondary | ICD-10-CM | POA: Diagnosis not present

## 2020-12-23 DIAGNOSIS — F0280 Dementia in other diseases classified elsewhere without behavioral disturbance: Secondary | ICD-10-CM | POA: Diagnosis not present

## 2020-12-26 DIAGNOSIS — I10 Essential (primary) hypertension: Secondary | ICD-10-CM | POA: Diagnosis not present

## 2020-12-26 DIAGNOSIS — E7800 Pure hypercholesterolemia, unspecified: Secondary | ICD-10-CM | POA: Diagnosis not present

## 2020-12-26 DIAGNOSIS — M25511 Pain in right shoulder: Secondary | ICD-10-CM | POA: Diagnosis not present

## 2020-12-26 DIAGNOSIS — K219 Gastro-esophageal reflux disease without esophagitis: Secondary | ICD-10-CM | POA: Diagnosis not present

## 2020-12-26 DIAGNOSIS — G301 Alzheimer's disease with late onset: Secondary | ICD-10-CM | POA: Diagnosis not present

## 2020-12-26 DIAGNOSIS — F0280 Dementia in other diseases classified elsewhere without behavioral disturbance: Secondary | ICD-10-CM | POA: Diagnosis not present

## 2020-12-26 DIAGNOSIS — M545 Low back pain, unspecified: Secondary | ICD-10-CM | POA: Diagnosis not present

## 2020-12-26 DIAGNOSIS — J392 Other diseases of pharynx: Secondary | ICD-10-CM | POA: Diagnosis not present

## 2020-12-26 DIAGNOSIS — M25551 Pain in right hip: Secondary | ICD-10-CM | POA: Diagnosis not present

## 2020-12-28 DIAGNOSIS — K219 Gastro-esophageal reflux disease without esophagitis: Secondary | ICD-10-CM | POA: Diagnosis not present

## 2020-12-28 DIAGNOSIS — F0280 Dementia in other diseases classified elsewhere without behavioral disturbance: Secondary | ICD-10-CM | POA: Diagnosis not present

## 2020-12-28 DIAGNOSIS — J392 Other diseases of pharynx: Secondary | ICD-10-CM | POA: Diagnosis not present

## 2020-12-28 DIAGNOSIS — E7800 Pure hypercholesterolemia, unspecified: Secondary | ICD-10-CM | POA: Diagnosis not present

## 2020-12-28 DIAGNOSIS — M25551 Pain in right hip: Secondary | ICD-10-CM | POA: Diagnosis not present

## 2020-12-28 DIAGNOSIS — I10 Essential (primary) hypertension: Secondary | ICD-10-CM | POA: Diagnosis not present

## 2020-12-28 DIAGNOSIS — M25511 Pain in right shoulder: Secondary | ICD-10-CM | POA: Diagnosis not present

## 2020-12-28 DIAGNOSIS — M545 Low back pain, unspecified: Secondary | ICD-10-CM | POA: Diagnosis not present

## 2020-12-28 DIAGNOSIS — G301 Alzheimer's disease with late onset: Secondary | ICD-10-CM | POA: Diagnosis not present

## 2020-12-29 DIAGNOSIS — C8590 Non-Hodgkin lymphoma, unspecified, unspecified site: Secondary | ICD-10-CM | POA: Diagnosis not present

## 2020-12-29 DIAGNOSIS — R591 Generalized enlarged lymph nodes: Secondary | ICD-10-CM | POA: Diagnosis not present

## 2020-12-29 DIAGNOSIS — D1809 Hemangioma of other sites: Secondary | ICD-10-CM | POA: Diagnosis not present

## 2020-12-29 DIAGNOSIS — I2510 Atherosclerotic heart disease of native coronary artery without angina pectoris: Secondary | ICD-10-CM | POA: Diagnosis not present

## 2020-12-29 DIAGNOSIS — D490 Neoplasm of unspecified behavior of digestive system: Secondary | ICD-10-CM | POA: Diagnosis not present

## 2020-12-30 DIAGNOSIS — M25511 Pain in right shoulder: Secondary | ICD-10-CM | POA: Diagnosis not present

## 2020-12-30 DIAGNOSIS — E7800 Pure hypercholesterolemia, unspecified: Secondary | ICD-10-CM | POA: Diagnosis not present

## 2020-12-30 DIAGNOSIS — K219 Gastro-esophageal reflux disease without esophagitis: Secondary | ICD-10-CM | POA: Diagnosis not present

## 2020-12-30 DIAGNOSIS — J392 Other diseases of pharynx: Secondary | ICD-10-CM | POA: Diagnosis not present

## 2020-12-30 DIAGNOSIS — I10 Essential (primary) hypertension: Secondary | ICD-10-CM | POA: Diagnosis not present

## 2020-12-30 DIAGNOSIS — M545 Low back pain, unspecified: Secondary | ICD-10-CM | POA: Diagnosis not present

## 2020-12-30 DIAGNOSIS — F0280 Dementia in other diseases classified elsewhere without behavioral disturbance: Secondary | ICD-10-CM | POA: Diagnosis not present

## 2020-12-30 DIAGNOSIS — M25551 Pain in right hip: Secondary | ICD-10-CM | POA: Diagnosis not present

## 2020-12-30 DIAGNOSIS — G301 Alzheimer's disease with late onset: Secondary | ICD-10-CM | POA: Diagnosis not present

## 2021-01-03 DIAGNOSIS — R160 Hepatomegaly, not elsewhere classified: Secondary | ICD-10-CM | POA: Diagnosis not present

## 2021-01-03 DIAGNOSIS — R011 Cardiac murmur, unspecified: Secondary | ICD-10-CM | POA: Diagnosis not present

## 2021-01-03 DIAGNOSIS — R591 Generalized enlarged lymph nodes: Secondary | ICD-10-CM | POA: Diagnosis not present

## 2021-01-03 DIAGNOSIS — I517 Cardiomegaly: Secondary | ICD-10-CM | POA: Diagnosis not present

## 2021-01-03 DIAGNOSIS — M5441 Lumbago with sciatica, right side: Secondary | ICD-10-CM | POA: Diagnosis not present

## 2021-01-03 DIAGNOSIS — R221 Localized swelling, mass and lump, neck: Secondary | ICD-10-CM | POA: Diagnosis not present

## 2021-01-03 DIAGNOSIS — M1711 Unilateral primary osteoarthritis, right knee: Secondary | ICD-10-CM | POA: Diagnosis not present

## 2021-01-03 DIAGNOSIS — E119 Type 2 diabetes mellitus without complications: Secondary | ICD-10-CM | POA: Diagnosis not present

## 2021-01-03 DIAGNOSIS — R6889 Other general symptoms and signs: Secondary | ICD-10-CM | POA: Diagnosis not present

## 2021-01-03 DIAGNOSIS — M5416 Radiculopathy, lumbar region: Secondary | ICD-10-CM | POA: Diagnosis not present

## 2021-01-03 DIAGNOSIS — J392 Other diseases of pharynx: Secondary | ICD-10-CM | POA: Diagnosis not present

## 2021-01-05 DIAGNOSIS — I10 Essential (primary) hypertension: Secondary | ICD-10-CM | POA: Diagnosis not present

## 2021-01-05 DIAGNOSIS — K219 Gastro-esophageal reflux disease without esophagitis: Secondary | ICD-10-CM | POA: Diagnosis not present

## 2021-01-05 DIAGNOSIS — G301 Alzheimer's disease with late onset: Secondary | ICD-10-CM | POA: Diagnosis not present

## 2021-01-05 DIAGNOSIS — M25551 Pain in right hip: Secondary | ICD-10-CM | POA: Diagnosis not present

## 2021-01-05 DIAGNOSIS — F0280 Dementia in other diseases classified elsewhere without behavioral disturbance: Secondary | ICD-10-CM | POA: Diagnosis not present

## 2021-01-05 DIAGNOSIS — M545 Low back pain, unspecified: Secondary | ICD-10-CM | POA: Diagnosis not present

## 2021-01-05 DIAGNOSIS — J392 Other diseases of pharynx: Secondary | ICD-10-CM | POA: Diagnosis not present

## 2021-01-05 DIAGNOSIS — E7800 Pure hypercholesterolemia, unspecified: Secondary | ICD-10-CM | POA: Diagnosis not present

## 2021-01-05 DIAGNOSIS — M25511 Pain in right shoulder: Secondary | ICD-10-CM | POA: Diagnosis not present

## 2021-01-06 DIAGNOSIS — I10 Essential (primary) hypertension: Secondary | ICD-10-CM | POA: Diagnosis not present

## 2021-01-06 DIAGNOSIS — M545 Low back pain, unspecified: Secondary | ICD-10-CM | POA: Diagnosis not present

## 2021-01-06 DIAGNOSIS — M25551 Pain in right hip: Secondary | ICD-10-CM | POA: Diagnosis not present

## 2021-01-06 DIAGNOSIS — K219 Gastro-esophageal reflux disease without esophagitis: Secondary | ICD-10-CM | POA: Diagnosis not present

## 2021-01-06 DIAGNOSIS — J392 Other diseases of pharynx: Secondary | ICD-10-CM | POA: Diagnosis not present

## 2021-01-06 DIAGNOSIS — M25511 Pain in right shoulder: Secondary | ICD-10-CM | POA: Diagnosis not present

## 2021-01-06 DIAGNOSIS — E7800 Pure hypercholesterolemia, unspecified: Secondary | ICD-10-CM | POA: Diagnosis not present

## 2021-01-06 DIAGNOSIS — G301 Alzheimer's disease with late onset: Secondary | ICD-10-CM | POA: Diagnosis not present

## 2021-01-06 DIAGNOSIS — F0280 Dementia in other diseases classified elsewhere without behavioral disturbance: Secondary | ICD-10-CM | POA: Diagnosis not present

## 2021-01-10 DIAGNOSIS — M1711 Unilateral primary osteoarthritis, right knee: Secondary | ICD-10-CM | POA: Diagnosis not present

## 2021-01-10 DIAGNOSIS — J029 Acute pharyngitis, unspecified: Secondary | ICD-10-CM | POA: Diagnosis not present

## 2021-01-10 DIAGNOSIS — W19XXXA Unspecified fall, initial encounter: Secondary | ICD-10-CM | POA: Diagnosis not present

## 2021-01-10 DIAGNOSIS — M25561 Pain in right knee: Secondary | ICD-10-CM | POA: Diagnosis not present

## 2021-01-12 DIAGNOSIS — R221 Localized swelling, mass and lump, neck: Secondary | ICD-10-CM | POA: Diagnosis not present

## 2021-01-12 DIAGNOSIS — Z888 Allergy status to other drugs, medicaments and biological substances status: Secondary | ICD-10-CM | POA: Diagnosis not present

## 2021-01-12 DIAGNOSIS — Z880 Allergy status to penicillin: Secondary | ICD-10-CM | POA: Diagnosis not present

## 2021-01-13 DIAGNOSIS — F0280 Dementia in other diseases classified elsewhere without behavioral disturbance: Secondary | ICD-10-CM | POA: Diagnosis not present

## 2021-01-13 DIAGNOSIS — G301 Alzheimer's disease with late onset: Secondary | ICD-10-CM | POA: Diagnosis not present

## 2021-01-13 DIAGNOSIS — I10 Essential (primary) hypertension: Secondary | ICD-10-CM | POA: Diagnosis not present

## 2021-01-13 DIAGNOSIS — M545 Low back pain, unspecified: Secondary | ICD-10-CM | POA: Diagnosis not present

## 2021-01-13 DIAGNOSIS — M25551 Pain in right hip: Secondary | ICD-10-CM | POA: Diagnosis not present

## 2021-01-13 DIAGNOSIS — K219 Gastro-esophageal reflux disease without esophagitis: Secondary | ICD-10-CM | POA: Diagnosis not present

## 2021-01-13 DIAGNOSIS — M25511 Pain in right shoulder: Secondary | ICD-10-CM | POA: Diagnosis not present

## 2021-01-13 DIAGNOSIS — E7800 Pure hypercholesterolemia, unspecified: Secondary | ICD-10-CM | POA: Diagnosis not present

## 2021-01-13 DIAGNOSIS — J392 Other diseases of pharynx: Secondary | ICD-10-CM | POA: Diagnosis not present

## 2021-01-14 DIAGNOSIS — M25551 Pain in right hip: Secondary | ICD-10-CM | POA: Diagnosis not present

## 2021-01-14 DIAGNOSIS — M25511 Pain in right shoulder: Secondary | ICD-10-CM | POA: Diagnosis not present

## 2021-01-14 DIAGNOSIS — F0280 Dementia in other diseases classified elsewhere without behavioral disturbance: Secondary | ICD-10-CM | POA: Diagnosis not present

## 2021-01-14 DIAGNOSIS — G301 Alzheimer's disease with late onset: Secondary | ICD-10-CM | POA: Diagnosis not present

## 2021-01-14 DIAGNOSIS — J392 Other diseases of pharynx: Secondary | ICD-10-CM | POA: Diagnosis not present

## 2021-01-14 DIAGNOSIS — E7800 Pure hypercholesterolemia, unspecified: Secondary | ICD-10-CM | POA: Diagnosis not present

## 2021-01-14 DIAGNOSIS — I10 Essential (primary) hypertension: Secondary | ICD-10-CM | POA: Diagnosis not present

## 2021-01-14 DIAGNOSIS — M545 Low back pain, unspecified: Secondary | ICD-10-CM | POA: Diagnosis not present

## 2021-01-14 DIAGNOSIS — K219 Gastro-esophageal reflux disease without esophagitis: Secondary | ICD-10-CM | POA: Diagnosis not present

## 2021-01-17 DIAGNOSIS — M25552 Pain in left hip: Secondary | ICD-10-CM | POA: Diagnosis not present

## 2021-01-17 DIAGNOSIS — M5459 Other low back pain: Secondary | ICD-10-CM | POA: Diagnosis not present

## 2021-01-17 DIAGNOSIS — M542 Cervicalgia: Secondary | ICD-10-CM | POA: Diagnosis not present

## 2021-01-19 DIAGNOSIS — F0280 Dementia in other diseases classified elsewhere without behavioral disturbance: Secondary | ICD-10-CM | POA: Diagnosis not present

## 2021-01-19 DIAGNOSIS — E7800 Pure hypercholesterolemia, unspecified: Secondary | ICD-10-CM | POA: Diagnosis not present

## 2021-01-19 DIAGNOSIS — J392 Other diseases of pharynx: Secondary | ICD-10-CM | POA: Diagnosis not present

## 2021-01-19 DIAGNOSIS — K219 Gastro-esophageal reflux disease without esophagitis: Secondary | ICD-10-CM | POA: Diagnosis not present

## 2021-01-19 DIAGNOSIS — M25551 Pain in right hip: Secondary | ICD-10-CM | POA: Diagnosis not present

## 2021-01-19 DIAGNOSIS — M25511 Pain in right shoulder: Secondary | ICD-10-CM | POA: Diagnosis not present

## 2021-01-19 DIAGNOSIS — M545 Low back pain, unspecified: Secondary | ICD-10-CM | POA: Diagnosis not present

## 2021-01-19 DIAGNOSIS — G301 Alzheimer's disease with late onset: Secondary | ICD-10-CM | POA: Diagnosis not present

## 2021-01-19 DIAGNOSIS — I10 Essential (primary) hypertension: Secondary | ICD-10-CM | POA: Diagnosis not present

## 2021-01-21 DIAGNOSIS — K219 Gastro-esophageal reflux disease without esophagitis: Secondary | ICD-10-CM | POA: Diagnosis not present

## 2021-01-21 DIAGNOSIS — M25511 Pain in right shoulder: Secondary | ICD-10-CM | POA: Diagnosis not present

## 2021-01-21 DIAGNOSIS — F0280 Dementia in other diseases classified elsewhere without behavioral disturbance: Secondary | ICD-10-CM | POA: Diagnosis not present

## 2021-01-21 DIAGNOSIS — I10 Essential (primary) hypertension: Secondary | ICD-10-CM | POA: Diagnosis not present

## 2021-01-21 DIAGNOSIS — M545 Low back pain, unspecified: Secondary | ICD-10-CM | POA: Diagnosis not present

## 2021-01-21 DIAGNOSIS — G301 Alzheimer's disease with late onset: Secondary | ICD-10-CM | POA: Diagnosis not present

## 2021-01-21 DIAGNOSIS — J392 Other diseases of pharynx: Secondary | ICD-10-CM | POA: Diagnosis not present

## 2021-01-21 DIAGNOSIS — E7800 Pure hypercholesterolemia, unspecified: Secondary | ICD-10-CM | POA: Diagnosis not present

## 2021-01-21 DIAGNOSIS — M25551 Pain in right hip: Secondary | ICD-10-CM | POA: Diagnosis not present

## 2021-01-24 DIAGNOSIS — M25551 Pain in right hip: Secondary | ICD-10-CM | POA: Diagnosis not present

## 2021-01-24 DIAGNOSIS — J392 Other diseases of pharynx: Secondary | ICD-10-CM | POA: Diagnosis not present

## 2021-01-24 DIAGNOSIS — M25511 Pain in right shoulder: Secondary | ICD-10-CM | POA: Diagnosis not present

## 2021-01-24 DIAGNOSIS — K219 Gastro-esophageal reflux disease without esophagitis: Secondary | ICD-10-CM | POA: Diagnosis not present

## 2021-01-24 DIAGNOSIS — M545 Low back pain, unspecified: Secondary | ICD-10-CM | POA: Diagnosis not present

## 2021-01-24 DIAGNOSIS — F0280 Dementia in other diseases classified elsewhere without behavioral disturbance: Secondary | ICD-10-CM | POA: Diagnosis not present

## 2021-01-24 DIAGNOSIS — G301 Alzheimer's disease with late onset: Secondary | ICD-10-CM | POA: Diagnosis not present

## 2021-01-24 DIAGNOSIS — I10 Essential (primary) hypertension: Secondary | ICD-10-CM | POA: Diagnosis not present

## 2021-01-24 DIAGNOSIS — E7800 Pure hypercholesterolemia, unspecified: Secondary | ICD-10-CM | POA: Diagnosis not present

## 2021-02-07 DIAGNOSIS — R6889 Other general symptoms and signs: Secondary | ICD-10-CM | POA: Diagnosis not present

## 2021-02-07 DIAGNOSIS — M545 Low back pain, unspecified: Secondary | ICD-10-CM | POA: Diagnosis not present

## 2021-02-07 DIAGNOSIS — M546 Pain in thoracic spine: Secondary | ICD-10-CM | POA: Diagnosis not present

## 2021-02-07 DIAGNOSIS — M542 Cervicalgia: Secondary | ICD-10-CM | POA: Diagnosis not present

## 2021-02-18 DIAGNOSIS — M48062 Spinal stenosis, lumbar region with neurogenic claudication: Secondary | ICD-10-CM | POA: Diagnosis not present

## 2021-02-18 DIAGNOSIS — G959 Disease of spinal cord, unspecified: Secondary | ICD-10-CM | POA: Diagnosis not present

## 2021-02-18 DIAGNOSIS — G8220 Paraplegia, unspecified: Secondary | ICD-10-CM | POA: Diagnosis not present

## 2021-03-17 DIAGNOSIS — D110 Benign neoplasm of parotid gland: Secondary | ICD-10-CM | POA: Diagnosis not present

## 2021-03-17 DIAGNOSIS — K219 Gastro-esophageal reflux disease without esophagitis: Secondary | ICD-10-CM | POA: Diagnosis not present

## 2021-03-17 DIAGNOSIS — R296 Repeated falls: Secondary | ICD-10-CM | POA: Diagnosis not present

## 2021-03-17 DIAGNOSIS — Z6830 Body mass index (BMI) 30.0-30.9, adult: Secondary | ICD-10-CM | POA: Diagnosis not present

## 2021-03-17 DIAGNOSIS — G992 Myelopathy in diseases classified elsewhere: Secondary | ICD-10-CM | POA: Diagnosis not present

## 2021-03-17 DIAGNOSIS — R1310 Dysphagia, unspecified: Secondary | ICD-10-CM | POA: Diagnosis not present

## 2021-03-17 DIAGNOSIS — D62 Acute posthemorrhagic anemia: Secondary | ICD-10-CM | POA: Diagnosis not present

## 2021-03-17 DIAGNOSIS — Z90710 Acquired absence of both cervix and uterus: Secondary | ICD-10-CM | POA: Diagnosis not present

## 2021-03-17 DIAGNOSIS — E119 Type 2 diabetes mellitus without complications: Secondary | ICD-10-CM | POA: Diagnosis not present

## 2021-03-28 DIAGNOSIS — R296 Repeated falls: Secondary | ICD-10-CM | POA: Diagnosis not present

## 2021-03-28 DIAGNOSIS — Z743 Need for continuous supervision: Secondary | ICD-10-CM | POA: Diagnosis not present

## 2021-03-28 DIAGNOSIS — Z6830 Body mass index (BMI) 30.0-30.9, adult: Secondary | ICD-10-CM | POA: Diagnosis not present

## 2021-03-28 DIAGNOSIS — G992 Myelopathy in diseases classified elsewhere: Secondary | ICD-10-CM | POA: Diagnosis not present

## 2021-03-28 DIAGNOSIS — R1312 Dysphagia, oropharyngeal phase: Secondary | ICD-10-CM | POA: Diagnosis not present

## 2021-03-28 DIAGNOSIS — R221 Localized swelling, mass and lump, neck: Secondary | ICD-10-CM | POA: Diagnosis not present

## 2021-03-28 DIAGNOSIS — R627 Adult failure to thrive: Secondary | ICD-10-CM | POA: Diagnosis not present

## 2021-03-28 DIAGNOSIS — E119 Type 2 diabetes mellitus without complications: Secondary | ICD-10-CM | POA: Diagnosis not present

## 2021-03-28 DIAGNOSIS — M6281 Muscle weakness (generalized): Secondary | ICD-10-CM | POA: Diagnosis not present

## 2021-03-28 DIAGNOSIS — D110 Benign neoplasm of parotid gland: Secondary | ICD-10-CM | POA: Diagnosis not present

## 2021-03-28 DIAGNOSIS — J392 Other diseases of pharynx: Secondary | ICD-10-CM | POA: Diagnosis not present

## 2021-03-28 DIAGNOSIS — R1310 Dysphagia, unspecified: Secondary | ICD-10-CM | POA: Diagnosis not present

## 2021-03-28 DIAGNOSIS — R000 Tachycardia, unspecified: Secondary | ICD-10-CM | POA: Diagnosis not present

## 2021-03-28 DIAGNOSIS — Z48815 Encounter for surgical aftercare following surgery on the digestive system: Secondary | ICD-10-CM | POA: Diagnosis not present

## 2021-03-28 DIAGNOSIS — Z90710 Acquired absence of both cervix and uterus: Secondary | ICD-10-CM | POA: Diagnosis not present

## 2021-03-28 DIAGNOSIS — J029 Acute pharyngitis, unspecified: Secondary | ICD-10-CM | POA: Diagnosis not present

## 2021-03-28 DIAGNOSIS — K219 Gastro-esophageal reflux disease without esophagitis: Secondary | ICD-10-CM | POA: Diagnosis not present

## 2021-03-28 DIAGNOSIS — D62 Acute posthemorrhagic anemia: Secondary | ICD-10-CM | POA: Diagnosis not present

## 2021-03-28 DIAGNOSIS — I10 Essential (primary) hypertension: Secondary | ICD-10-CM | POA: Diagnosis not present

## 2021-03-28 DIAGNOSIS — R279 Unspecified lack of coordination: Secondary | ICD-10-CM | POA: Diagnosis not present

## 2021-03-28 DIAGNOSIS — R0902 Hypoxemia: Secondary | ICD-10-CM | POA: Diagnosis not present

## 2021-03-28 DIAGNOSIS — D3705 Neoplasm of uncertain behavior of pharynx: Secondary | ICD-10-CM | POA: Diagnosis not present

## 2021-03-28 DIAGNOSIS — R531 Weakness: Secondary | ICD-10-CM | POA: Diagnosis not present

## 2021-04-01 DIAGNOSIS — M25561 Pain in right knee: Secondary | ICD-10-CM | POA: Diagnosis not present

## 2021-04-01 DIAGNOSIS — J029 Acute pharyngitis, unspecified: Secondary | ICD-10-CM | POA: Diagnosis not present

## 2021-04-01 DIAGNOSIS — R531 Weakness: Secondary | ICD-10-CM | POA: Diagnosis not present

## 2021-04-01 DIAGNOSIS — M1711 Unilateral primary osteoarthritis, right knee: Secondary | ICD-10-CM | POA: Diagnosis not present

## 2021-04-01 DIAGNOSIS — I82431 Acute embolism and thrombosis of right popliteal vein: Secondary | ICD-10-CM | POA: Diagnosis not present

## 2021-04-01 DIAGNOSIS — E119 Type 2 diabetes mellitus without complications: Secondary | ICD-10-CM | POA: Diagnosis not present

## 2021-04-01 DIAGNOSIS — I82411 Acute embolism and thrombosis of right femoral vein: Secondary | ICD-10-CM | POA: Diagnosis not present

## 2021-04-01 DIAGNOSIS — Z48815 Encounter for surgical aftercare following surgery on the digestive system: Secondary | ICD-10-CM | POA: Diagnosis not present

## 2021-04-01 DIAGNOSIS — R1312 Dysphagia, oropharyngeal phase: Secondary | ICD-10-CM | POA: Diagnosis not present

## 2021-04-01 DIAGNOSIS — I82409 Acute embolism and thrombosis of unspecified deep veins of unspecified lower extremity: Secondary | ICD-10-CM | POA: Diagnosis not present

## 2021-04-01 DIAGNOSIS — M79603 Pain in arm, unspecified: Secondary | ICD-10-CM | POA: Diagnosis not present

## 2021-04-01 DIAGNOSIS — M79671 Pain in right foot: Secondary | ICD-10-CM | POA: Diagnosis not present

## 2021-04-01 DIAGNOSIS — M79604 Pain in right leg: Secondary | ICD-10-CM | POA: Diagnosis not present

## 2021-04-01 DIAGNOSIS — Z743 Need for continuous supervision: Secondary | ICD-10-CM | POA: Diagnosis not present

## 2021-04-01 DIAGNOSIS — D490 Neoplasm of unspecified behavior of digestive system: Secondary | ICD-10-CM | POA: Diagnosis not present

## 2021-04-01 DIAGNOSIS — R0902 Hypoxemia: Secondary | ICD-10-CM | POA: Diagnosis not present

## 2021-04-01 DIAGNOSIS — R000 Tachycardia, unspecified: Secondary | ICD-10-CM | POA: Diagnosis not present

## 2021-04-01 DIAGNOSIS — J392 Other diseases of pharynx: Secondary | ICD-10-CM | POA: Diagnosis not present

## 2021-04-01 DIAGNOSIS — Z483 Aftercare following surgery for neoplasm: Secondary | ICD-10-CM | POA: Diagnosis not present

## 2021-04-01 DIAGNOSIS — I10 Essential (primary) hypertension: Secondary | ICD-10-CM | POA: Diagnosis not present

## 2021-04-01 DIAGNOSIS — D109 Benign neoplasm of pharynx, unspecified: Secondary | ICD-10-CM | POA: Diagnosis not present

## 2021-04-01 DIAGNOSIS — M6281 Muscle weakness (generalized): Secondary | ICD-10-CM | POA: Diagnosis not present

## 2021-04-01 DIAGNOSIS — R1310 Dysphagia, unspecified: Secondary | ICD-10-CM | POA: Diagnosis not present

## 2021-04-01 DIAGNOSIS — G8911 Acute pain due to trauma: Secondary | ICD-10-CM | POA: Diagnosis not present

## 2021-04-01 DIAGNOSIS — R279 Unspecified lack of coordination: Secondary | ICD-10-CM | POA: Diagnosis not present

## 2021-04-01 DIAGNOSIS — M25571 Pain in right ankle and joints of right foot: Secondary | ICD-10-CM | POA: Diagnosis not present

## 2021-04-01 DIAGNOSIS — R627 Adult failure to thrive: Secondary | ICD-10-CM | POA: Diagnosis not present

## 2021-04-04 ENCOUNTER — Other Ambulatory Visit: Payer: Self-pay

## 2021-04-04 DIAGNOSIS — E119 Type 2 diabetes mellitus without complications: Secondary | ICD-10-CM | POA: Diagnosis not present

## 2021-04-04 DIAGNOSIS — J392 Other diseases of pharynx: Secondary | ICD-10-CM | POA: Diagnosis not present

## 2021-04-04 DIAGNOSIS — R627 Adult failure to thrive: Secondary | ICD-10-CM | POA: Diagnosis not present

## 2021-04-04 DIAGNOSIS — R531 Weakness: Secondary | ICD-10-CM | POA: Diagnosis not present

## 2021-04-04 DIAGNOSIS — I10 Essential (primary) hypertension: Secondary | ICD-10-CM | POA: Diagnosis not present

## 2021-04-04 NOTE — Patient Outreach (Signed)
[place] [facility]) Care Management  04/04/2021  Danielle Tate 1940/12/31 [id number]     Transition of Care Referral  Referral Date: 04/04/2021 Referral Source: [employer] Discharge Report Date of Discharge: 04/01/2021 Facility: [facility]    Referral received. Per EMR patient discharged to [facility] and no longer has [facility] PCP.    Plan: RN CM will close referral at this time    Enzo Montgomery, RN,BSN,CCM [place] Management Coordinator Direct Phone: [phone] Toll Free: [phone] Fax: [phone]

## 2021-04-19 DIAGNOSIS — R1310 Dysphagia, unspecified: Secondary | ICD-10-CM | POA: Diagnosis not present

## 2021-04-19 DIAGNOSIS — Z483 Aftercare following surgery for neoplasm: Secondary | ICD-10-CM | POA: Diagnosis not present

## 2021-04-19 DIAGNOSIS — D490 Neoplasm of unspecified behavior of digestive system: Secondary | ICD-10-CM | POA: Diagnosis not present

## 2021-04-20 DIAGNOSIS — I10 Essential (primary) hypertension: Secondary | ICD-10-CM | POA: Diagnosis not present

## 2021-04-20 DIAGNOSIS — J392 Other diseases of pharynx: Secondary | ICD-10-CM | POA: Diagnosis not present

## 2021-04-20 DIAGNOSIS — E119 Type 2 diabetes mellitus without complications: Secondary | ICD-10-CM | POA: Diagnosis not present

## 2021-04-20 DIAGNOSIS — R531 Weakness: Secondary | ICD-10-CM | POA: Diagnosis not present

## 2021-04-20 DIAGNOSIS — R627 Adult failure to thrive: Secondary | ICD-10-CM | POA: Diagnosis not present

## 2021-04-21 DIAGNOSIS — I10 Essential (primary) hypertension: Secondary | ICD-10-CM | POA: Diagnosis not present

## 2021-04-21 DIAGNOSIS — E119 Type 2 diabetes mellitus without complications: Secondary | ICD-10-CM | POA: Diagnosis not present

## 2021-04-25 DIAGNOSIS — R627 Adult failure to thrive: Secondary | ICD-10-CM | POA: Diagnosis not present

## 2021-04-25 DIAGNOSIS — M25561 Pain in right knee: Secondary | ICD-10-CM | POA: Diagnosis not present

## 2021-04-25 DIAGNOSIS — M79671 Pain in right foot: Secondary | ICD-10-CM | POA: Diagnosis not present

## 2021-04-25 DIAGNOSIS — J392 Other diseases of pharynx: Secondary | ICD-10-CM | POA: Diagnosis not present

## 2021-04-25 DIAGNOSIS — E119 Type 2 diabetes mellitus without complications: Secondary | ICD-10-CM | POA: Diagnosis not present

## 2021-04-25 DIAGNOSIS — I10 Essential (primary) hypertension: Secondary | ICD-10-CM | POA: Diagnosis not present

## 2021-04-25 DIAGNOSIS — M25571 Pain in right ankle and joints of right foot: Secondary | ICD-10-CM | POA: Diagnosis not present

## 2021-05-03 DIAGNOSIS — R531 Weakness: Secondary | ICD-10-CM | POA: Diagnosis not present

## 2021-05-03 DIAGNOSIS — J392 Other diseases of pharynx: Secondary | ICD-10-CM | POA: Diagnosis not present

## 2021-05-03 DIAGNOSIS — I10 Essential (primary) hypertension: Secondary | ICD-10-CM | POA: Diagnosis not present

## 2021-05-03 DIAGNOSIS — R627 Adult failure to thrive: Secondary | ICD-10-CM | POA: Diagnosis not present

## 2021-05-03 DIAGNOSIS — E119 Type 2 diabetes mellitus without complications: Secondary | ICD-10-CM | POA: Diagnosis not present

## 2021-05-05 DIAGNOSIS — I82431 Acute embolism and thrombosis of right popliteal vein: Secondary | ICD-10-CM | POA: Diagnosis not present

## 2021-05-05 DIAGNOSIS — I82409 Acute embolism and thrombosis of unspecified deep veins of unspecified lower extremity: Secondary | ICD-10-CM | POA: Diagnosis not present

## 2021-05-05 DIAGNOSIS — M25561 Pain in right knee: Secondary | ICD-10-CM | POA: Diagnosis not present

## 2021-05-05 DIAGNOSIS — G8911 Acute pain due to trauma: Secondary | ICD-10-CM | POA: Diagnosis not present

## 2021-05-05 DIAGNOSIS — I10 Essential (primary) hypertension: Secondary | ICD-10-CM | POA: Diagnosis not present

## 2021-05-05 DIAGNOSIS — M79604 Pain in right leg: Secondary | ICD-10-CM | POA: Diagnosis not present

## 2021-05-05 DIAGNOSIS — M79603 Pain in arm, unspecified: Secondary | ICD-10-CM | POA: Diagnosis not present

## 2021-05-05 DIAGNOSIS — M1711 Unilateral primary osteoarthritis, right knee: Secondary | ICD-10-CM | POA: Diagnosis not present

## 2021-05-05 DIAGNOSIS — I82411 Acute embolism and thrombosis of right femoral vein: Secondary | ICD-10-CM | POA: Diagnosis not present

## 2021-05-10 DIAGNOSIS — Z483 Aftercare following surgery for neoplasm: Secondary | ICD-10-CM | POA: Diagnosis not present

## 2021-05-10 DIAGNOSIS — D109 Benign neoplasm of pharynx, unspecified: Secondary | ICD-10-CM | POA: Diagnosis not present

## 2021-05-16 DIAGNOSIS — M1711 Unilateral primary osteoarthritis, right knee: Secondary | ICD-10-CM | POA: Diagnosis not present

## 2021-05-25 DIAGNOSIS — I82409 Acute embolism and thrombosis of unspecified deep veins of unspecified lower extremity: Secondary | ICD-10-CM | POA: Diagnosis not present

## 2021-05-25 DIAGNOSIS — R627 Adult failure to thrive: Secondary | ICD-10-CM | POA: Diagnosis not present

## 2021-05-25 DIAGNOSIS — R531 Weakness: Secondary | ICD-10-CM | POA: Diagnosis not present

## 2021-05-25 DIAGNOSIS — I10 Essential (primary) hypertension: Secondary | ICD-10-CM | POA: Diagnosis not present

## 2021-05-25 DIAGNOSIS — E119 Type 2 diabetes mellitus without complications: Secondary | ICD-10-CM | POA: Diagnosis not present

## 2021-05-29 DIAGNOSIS — E119 Type 2 diabetes mellitus without complications: Secondary | ICD-10-CM | POA: Diagnosis not present

## 2021-05-29 DIAGNOSIS — I10 Essential (primary) hypertension: Secondary | ICD-10-CM | POA: Diagnosis not present

## 2021-06-01 DIAGNOSIS — R627 Adult failure to thrive: Secondary | ICD-10-CM | POA: Diagnosis not present

## 2021-06-01 DIAGNOSIS — R531 Weakness: Secondary | ICD-10-CM | POA: Diagnosis not present

## 2021-06-01 DIAGNOSIS — I82409 Acute embolism and thrombosis of unspecified deep veins of unspecified lower extremity: Secondary | ICD-10-CM | POA: Diagnosis not present

## 2021-06-01 DIAGNOSIS — I10 Essential (primary) hypertension: Secondary | ICD-10-CM | POA: Diagnosis not present

## 2021-06-01 DIAGNOSIS — E119 Type 2 diabetes mellitus without complications: Secondary | ICD-10-CM | POA: Diagnosis not present

## 2021-06-20 DIAGNOSIS — R627 Adult failure to thrive: Secondary | ICD-10-CM | POA: Diagnosis not present

## 2021-06-20 DIAGNOSIS — I10 Essential (primary) hypertension: Secondary | ICD-10-CM | POA: Diagnosis not present

## 2021-06-20 DIAGNOSIS — M1711 Unilateral primary osteoarthritis, right knee: Secondary | ICD-10-CM | POA: Diagnosis not present

## 2021-06-20 DIAGNOSIS — J392 Other diseases of pharynx: Secondary | ICD-10-CM | POA: Diagnosis not present

## 2021-06-20 DIAGNOSIS — E119 Type 2 diabetes mellitus without complications: Secondary | ICD-10-CM | POA: Diagnosis not present

## 2021-06-20 DIAGNOSIS — I82409 Acute embolism and thrombosis of unspecified deep veins of unspecified lower extremity: Secondary | ICD-10-CM | POA: Diagnosis not present

## 2021-06-20 DIAGNOSIS — R531 Weakness: Secondary | ICD-10-CM | POA: Diagnosis not present

## 2021-06-23 DIAGNOSIS — E119 Type 2 diabetes mellitus without complications: Secondary | ICD-10-CM | POA: Diagnosis not present

## 2021-06-23 DIAGNOSIS — I10 Essential (primary) hypertension: Secondary | ICD-10-CM | POA: Diagnosis not present

## 2021-07-04 DIAGNOSIS — Z888 Allergy status to other drugs, medicaments and biological substances status: Secondary | ICD-10-CM | POA: Diagnosis not present

## 2021-07-04 DIAGNOSIS — Z880 Allergy status to penicillin: Secondary | ICD-10-CM | POA: Diagnosis not present

## 2021-07-04 DIAGNOSIS — M545 Low back pain, unspecified: Secondary | ICD-10-CM | POA: Diagnosis not present

## 2021-07-04 DIAGNOSIS — R29898 Other symptoms and signs involving the musculoskeletal system: Secondary | ICD-10-CM | POA: Diagnosis not present

## 2021-07-04 DIAGNOSIS — G959 Disease of spinal cord, unspecified: Secondary | ICD-10-CM | POA: Diagnosis not present

## 2021-07-04 DIAGNOSIS — R269 Unspecified abnormalities of gait and mobility: Secondary | ICD-10-CM | POA: Diagnosis not present

## 2021-07-04 DIAGNOSIS — M5416 Radiculopathy, lumbar region: Secondary | ICD-10-CM | POA: Diagnosis not present

## 2021-07-13 DIAGNOSIS — E119 Type 2 diabetes mellitus without complications: Secondary | ICD-10-CM | POA: Diagnosis not present

## 2021-07-15 DIAGNOSIS — I82409 Acute embolism and thrombosis of unspecified deep veins of unspecified lower extremity: Secondary | ICD-10-CM | POA: Diagnosis not present

## 2021-07-15 DIAGNOSIS — M5021 Other cervical disc displacement,  high cervical region: Secondary | ICD-10-CM | POA: Diagnosis not present

## 2021-07-15 DIAGNOSIS — M545 Low back pain, unspecified: Secondary | ICD-10-CM | POA: Diagnosis not present

## 2021-07-15 DIAGNOSIS — R627 Adult failure to thrive: Secondary | ICD-10-CM | POA: Diagnosis not present

## 2021-07-15 DIAGNOSIS — M50222 Other cervical disc displacement at C5-C6 level: Secondary | ICD-10-CM | POA: Diagnosis not present

## 2021-07-15 DIAGNOSIS — M48061 Spinal stenosis, lumbar region without neurogenic claudication: Secondary | ICD-10-CM | POA: Diagnosis not present

## 2021-07-15 DIAGNOSIS — R531 Weakness: Secondary | ICD-10-CM | POA: Diagnosis not present

## 2021-07-15 DIAGNOSIS — M4316 Spondylolisthesis, lumbar region: Secondary | ICD-10-CM | POA: Diagnosis not present

## 2021-07-15 DIAGNOSIS — E119 Type 2 diabetes mellitus without complications: Secondary | ICD-10-CM | POA: Diagnosis not present

## 2021-07-15 DIAGNOSIS — I10 Essential (primary) hypertension: Secondary | ICD-10-CM | POA: Diagnosis not present

## 2021-07-15 DIAGNOSIS — M50223 Other cervical disc displacement at C6-C7 level: Secondary | ICD-10-CM | POA: Diagnosis not present

## 2021-07-15 DIAGNOSIS — R2689 Other abnormalities of gait and mobility: Secondary | ICD-10-CM | POA: Diagnosis not present

## 2021-07-18 DIAGNOSIS — M4312 Spondylolisthesis, cervical region: Secondary | ICD-10-CM | POA: Diagnosis not present

## 2021-07-18 DIAGNOSIS — M8588 Other specified disorders of bone density and structure, other site: Secondary | ICD-10-CM | POA: Diagnosis not present

## 2021-07-18 DIAGNOSIS — G959 Disease of spinal cord, unspecified: Secondary | ICD-10-CM | POA: Diagnosis not present

## 2021-07-18 DIAGNOSIS — M5031 Other cervical disc degeneration,  high cervical region: Secondary | ICD-10-CM | POA: Diagnosis not present

## 2021-07-20 DIAGNOSIS — R531 Weakness: Secondary | ICD-10-CM | POA: Diagnosis not present

## 2021-07-20 DIAGNOSIS — I10 Essential (primary) hypertension: Secondary | ICD-10-CM | POA: Diagnosis not present

## 2021-07-20 DIAGNOSIS — M5410 Radiculopathy, site unspecified: Secondary | ICD-10-CM | POA: Diagnosis not present

## 2021-07-20 DIAGNOSIS — G959 Disease of spinal cord, unspecified: Secondary | ICD-10-CM | POA: Diagnosis not present

## 2021-07-20 DIAGNOSIS — I82409 Acute embolism and thrombosis of unspecified deep veins of unspecified lower extremity: Secondary | ICD-10-CM | POA: Diagnosis not present

## 2021-07-20 DIAGNOSIS — E119 Type 2 diabetes mellitus without complications: Secondary | ICD-10-CM | POA: Diagnosis not present

## 2022-02-14 NOTE — Patient Instructions (Signed)
DUE TO COVID-19 ONLY ONE VISITOR  (aged 81 and older)  IS ALLOWED TO COME WITH YOU AND STAY IN THE WAITING ROOM ONLY DURING PRE OP AND PROCEDURE.   ?**NO VISITORS ARE ALLOWED IN THE SHORT STAY AREA OR RECOVERY ROOM!!** ? ?IF YOU WILL BE ADMITTED INTO THE HOSPITAL YOU ARE ALLOWED ONLY TWO SUPPORT PEOPLE DURING VISITATION HOURS ONLY (7 AM -8PM)   ?The support person(s) must pass our screening, gel in and out, and wear a mask at all times, including in the patient?s room. ?Patients must also wear a mask when staff or their support person are in the room. ?Visitors GUEST BADGE MUST BE WORN VISIBLY  ?One adult visitor may remain with you overnight and MUST be in the room by 8 P.M. ?  ? ? Your procedure is scheduled on: 02/21/22 ? ? Report to [facility] Main Entrance ? ?  Report to admitting at: 10:10 AM ? ? Call this number if you have problems the morning of surgery [phone] ? ? Do not eat food :After Midnight. ? ? After Midnight you may have the following liquids until : 9:45 AM DAY OF SURGERY ? ?Water ?Black Coffee (sugar ok, NO MILK/CREAM OR CREAMERS)  ?Tea (sugar ok, NO MILK/CREAM OR CREAMERS) regular and decaf                             ?Plain Jell-O (NO RED)                                           ?Fruit ices (not with fruit pulp, NO RED)                                     ?Popsicles (NO RED)                                                                  ?Juice: apple, WHITE grape, WHITE cranberry ?Sports drinks like Gatorade (NO RED) ?Clear broth(vegetable,chicken,beef) ? ?Drink  Ensure drink AT :9:45 AM the day of surgery.   ?  ?The day of surgery:  ?Drink ONE (1) Pre-Surgery Clear Ensure or G2 at AM the morning of surgery. Drink in one sitting. Do not sip.  ?This drink was given to you during your hospital  ?pre-op appointment visit. ?Nothing else to drink after completing the  ?Pre-Surgery Clear Ensure or G2. ?  ?       If you have questions, please contact your surgeon?s office.  ?  ?Oral  Hygiene is also important to reduce your risk of infection.                                    ?Remember - BRUSH YOUR TEETH THE MORNING OF SURGERY WITH YOUR REGULAR TOOTHPASTE ? ? Do NOT smoke after Midnight ? ? Take these medicines the morning of surgery with A SIP OF WATER: gabapentin,amlodipine,cefadroxil,omeprazole.Cetirizine as needed.Flonase and eye drops as usual. ? ?  DO NOT TAKE ANY ORAL DIABETIC MEDICATIONS DAY OF YOUR SURGERY ? ?Bring CPAP mask and tubing day of surgery. ?                  ?           You may not have any metal on your body including hair pins, jewelry, and body piercing ? ?           Do not wear make-up, lotions, powders, perfumes/cologne, or deodorant ? ?Do not wear nail polish including gel and S&S, artificial/acrylic nails, or any other type of covering on natural nails including finger and toenails. If you have artificial nails, gel coating, etc. that needs to be removed by a nail salon please have this removed prior to surgery or surgery may need to be canceled/ delayed if the surgeon/ anesthesia feels like they are unable to be safely monitored.  ? ?Do not shave  48 hours prior to surgery.  ? ? Do not bring valuables to the hospital. [facility] NOT ?            RESPONSIBLE   FOR VALUABLES. ? ? Contacts, dentures or bridgework may not be worn into surgery. ? ? Bring small overnight bag day of surgery. ?  ? Patients discharged on the day of surgery will not be allowed to drive home.  Someone NEEDS to stay with you for the first 24 hours after anesthesia. ? ? Special Instructions: Bring a copy of your healthcare power of attorney and living will documents         the day of surgery if you haven't scanned them before. ? ?            Please read over the following fact sheets you were given: IF [place] [phone] ? ?   [facility] - Preparing for Surgery ?Before surgery, you can play an important role.  Because skin is not  sterile, your skin needs to be as free of germs as possible.  You can reduce the number of germs on your skin by washing with CHG (chlorahexidine gluconate) soap before surgery.  CHG is an antiseptic cleaner which kills germs and bonds with the skin to continue killing germs even after washing. ?Please DO NOT use if you have an allergy to CHG or antibacterial soaps.  If your skin becomes reddened/irritated stop using the CHG and inform your nurse when you arrive at Short Stay. ?Do not shave (including legs and underarms) for at least 48 hours prior to the first CHG shower.  You may shave your face/neck. ?Please follow these instructions carefully: ? 1.  Shower with CHG Soap the night before surgery and the  morning of Surgery. ? 2.  If you choose to wash your hair, wash your hair first as usual with your  normal  shampoo. ? 3.  After you shampoo, rinse your hair and body thoroughly to remove the  shampoo.                           4.  Use CHG as you would any other liquid soap.  You can apply chg directly  to the skin and wash  ?                     Gently with a scrungie or clean washcloth. ? 5.  Apply the CHG Soap  to your body ONLY FROM THE NECK DOWN.   Do not use on face/ open      ?                     Wound or open sores. Avoid contact with eyes, ears mouth and genitals (private parts).  ?                     Production manager,  Genitals (private parts) with your normal soap. ?            6.  Wash thoroughly, paying special attention to the area where your surgery  will be performed. ? 7.  Thoroughly rinse your body with warm water from the neck down. ? 8.  DO NOT shower/wash with your normal soap after using and rinsing off  the CHG Soap. ?               9.  Pat yourself dry with a clean towel. ?           10.  Wear clean pajamas. ?           11.  Place clean sheets on your bed the night of your first shower and do not  sleep with pets. ?Day of Surgery : ?Do not apply any lotions/deodorants the morning of surgery.   Please wear clean clothes to the hospital/surgery center. ? ?FAILURE TO FOLLOW THESE INSTRUCTIONS MAY RESULT IN THE CANCELLATION OF YOUR SURGERY ?PATIENT SIGNATURE_________________________________ ? ?NURSE SIGNATURE__________________________________ ? ?________________________________________________________________________  ? ?Incentive Spirometer ? ?An incentive spirometer is a tool that can help keep your lungs clear and active. This tool measures how well you are filling your lungs with each breath. Taking long deep breaths may help reverse or decrease the chance of developing breathing (pulmonary) problems (especially infection) following: ?A long period of time when you are unable to move or be active. ?BEFORE THE PROCEDURE  ?If the spirometer includes an indicator to show your best effort, your nurse or respiratory therapist will set it to a desired goal. ?If possible, sit up straight or lean slightly forward. Try not to slouch. ?Hold the incentive spirometer in an upright position. ?INSTRUCTIONS FOR USE  ?Sit on the edge of your bed if possible, or sit up as far as you can in bed or on a chair. ?Hold the incentive spirometer in an upright position. ?Breathe out normally. ?Place the mouthpiece in your mouth and seal your lips tightly around it. ?Breathe in slowly and as deeply as possible, raising the piston or the ball toward the top of the column. ?Hold your breath for 3-5 seconds or for as long as possible. Allow the piston or ball to fall to the bottom of the column. ?Remove the mouthpiece from your mouth and breathe out normally. ?Rest for a few seconds and repeat Steps 1 through 7 at least 10 times every 1-2 hours when you are awake. Take your time and take a few normal breaths between deep breaths. ?The spirometer may include an indicator to show your best effort. Use the indicator as a goal to work toward during each repetition. ?After each set of 10 deep breaths, practice coughing to be sure your  lungs are clear. If you have an incision (the cut made at the time of surgery), support your incision when coughing by placing a pillow or rolled up towels firmly against it. ?Once you are able to get out of bed,

## 2022-02-15 ENCOUNTER — Encounter (HOSPITAL_COMMUNITY)
Admission: RE | Admit: 2022-02-15 | Discharge: 2022-02-15 | Disposition: A | Discharge status: Home / Self Care | Payer: Medicare HMO | Source: Ambulatory Visit | Attending: Orthopedic Surgery | Admitting: Orthopedic Surgery

## 2022-02-15 ENCOUNTER — Encounter (HOSPITAL_COMMUNITY): Payer: Self-pay

## 2022-02-15 ENCOUNTER — Other Ambulatory Visit: Payer: Self-pay

## 2022-02-15 VITALS — BP 148/67 | HR 87 | Temp 98.4°F | Resp 18 | Ht 63.0 in | Wt 141.0 lb

## 2022-02-15 DIAGNOSIS — Z01812 Encounter for preprocedural laboratory examination: Secondary | ICD-10-CM | POA: Diagnosis present

## 2022-02-15 DIAGNOSIS — M1711 Unilateral primary osteoarthritis, right knee: Secondary | ICD-10-CM | POA: Diagnosis not present

## 2022-02-15 DIAGNOSIS — Z01818 Encounter for other preprocedural examination: Secondary | ICD-10-CM

## 2022-02-15 HISTORY — DX: Unspecified osteoarthritis, unspecified site: M19.90

## 2022-02-15 LAB — COMPREHENSIVE METABOLIC PANEL
ALT: 14 U/L (ref 0–44)
AST: 15 U/L (ref 15–41)
Albumin: 4 g/dL (ref 3.5–5.0)
Alkaline Phosphatase: 48 U/L (ref 38–126)
Anion gap: 7 (ref 5–15)
BUN: 29 mg/dL — ABNORMAL HIGH (ref 8–23)
CO2: 27 mmol/L (ref 22–32)
Calcium: 9.1 mg/dL (ref 8.9–10.3)
Chloride: 110 mmol/L (ref 98–111)
Creatinine, Ser: 0.74 mg/dL (ref 0.44–1.00)
GFR, Estimated: >60 mL/min (ref >60)
Glucose, Bld: 87 mg/dL (ref 70–99)
Potassium: 4 mmol/L (ref 3.5–5.1)
Sodium: 144 mmol/L (ref 135–145)
Total Bilirubin: 0.5 mg/dL (ref 0.3–1.2)
Total Protein: 7.8 g/dL (ref 6.5–8.1)

## 2022-02-15 LAB — CBC
HCT: 37.5 % (ref 36.0–46.0)
Hemoglobin: 11.7 g/dL — ABNORMAL LOW (ref 12.0–15.0)
MCH: 25.3 pg — ABNORMAL LOW (ref 26.0–34.0)
MCHC: 31.2 g/dL (ref 30.0–36.0)
MCV: 81.2 fL (ref 80.0–100.0)
Platelets: 295 10*3/uL (ref 150–400)
RBC: 4.62 MIL/uL (ref 3.87–5.11)
RDW: 17.8 % — ABNORMAL HIGH (ref 11.5–15.5)
WBC: 6.9 10*3/uL (ref 4.0–10.5)
nRBC: 0 % (ref 0.0–0.2)

## 2022-02-15 LAB — SURGICAL PCR SCREEN
MRSA, PCR: NEGATIVE
Staphylococcus aureus: NEGATIVE

## 2022-02-15 NOTE — Progress Notes (Signed)
For Short Stay: ?[facility] appointment date: ?Date of COVID positive in last 90 days: ?COVID Vaccine: Pfizer x 3 ?Bowel Prep reminder: ? ? ?For Anesthesia: ?PCP - Dr. Maurice Small ?Cardiologist - Dr. Abran Richard. LOV: 02/14/22: [facility] ? ?Chest x-ray -  ?EKG - 02/14/22: requested ?Stress Test -  ?ECHO -  ?Cardiac Cath -  ?Pacemaker/ICD device last checked: ?Pacemaker orders received: ?Device Rep notified: ? ?Spinal Cord Stimulator: ? ?Sleep Study -  ?CPAP -  ? ?Fasting Blood Sugar -  ?Checks Blood Sugar _____ times a day ?Date and result of last Hgb A1c- ? ?Blood Thinner Instructions: Xarelto will be held 2 days before surgery as per Dr. Alvan Dame instructions. ?Aspirin Instructions: ?Last Dose: ? ?Activity level: Can go up a flight of stairs and activities of daily living without stopping and without chest pain and/or shortness of breath ?  Able to exercise without chest pain and/or shortness of breath ?  Unable to go up a flight of stairs without chest pain and/or shortness of breath ?   ? ?Anesthesia review: Hx: HTN,DVT ? ?Patient denies shortness of breath, fever, cough and chest pain at PAT appointment ? ? ?Patient verbalized understanding of instructions that were given to them at the PAT appointment. Patient was also instructed that they will need to review over the PAT instructions again at home before surgery.  ?

## 2022-02-20 NOTE — H&P (Signed)
TOTAL KNEE ADMISSION H&P ? ?Patient is being admitted for right total knee arthroplasty. ? ?Subjective: ? ?Chief Complaint:right knee pain. ? ?HPI: Danielle Tate, 81 y.o. female, has a history of pain and functional disability in the right knee due to arthritis and has failed non-surgical conservative treatments for greater than 12 weeks to includeNSAID's and/or analgesics, corticosteriod injections, and activity modification.  Onset of symptoms was gradual, starting 2 years ago with gradually worsening course since that time. The patient noted no past surgery on the right knee(s).  Patient currently rates pain in the right knee(s) at 8 out of 10 with activity. Patient has worsening of pain with activity and weight bearing and pain that interferes with activities of daily living.  Patient has evidence of joint space narrowing by imaging studies. There is no active infection. ? ?Patient Active Problem List  ? Diagnosis Date Noted  ? S/P left THA, AA 05/08/2019  ? Osteoarthritis of left hip 05/08/2019  ? ?Past Medical History:  ?Diagnosis Date  ? Arthritis   ? Hypertension   ?  ?Past Surgical History:  ?Procedure Laterality Date  ? ABDOMINAL HYSTERECTOMY    ? APPENDECTOMY    ? TONSILLECTOMY    ? TOTAL HIP ARTHROPLASTY Left 05/08/2019  ? Procedure: TOTAL HIP ARTHROPLASTY ANTERIOR APPROACH;  Surgeon: Paralee Cancel, MD;  Location: [facility] OR[facility];  Service: Orthopedics;  Laterality: Left;  ?  ?No current facility-administered medications for this encounter.  ? ?Current Outpatient Medications  ?Medication Sig Dispense Refill Last Dose  ? amLODipine-benazepril (LOTREL) 5-40 MG capsule Take 1 capsule by mouth every morning.     ? atorvastatin (LIPITOR) 40 MG tablet Take 40 mg by mouth every morning.     ? bimatoprost (LUMIGAN) 0.01 % SOLN Place 1 drop into both eyes at bedtime.     ? carboxymethylcellulose (REFRESH TEARS) 0.5 % SOLN Place 1 drop into both eyes 3 (three) times daily as needed (eye drops).     ? cetirizine (ZYRTEC) 10  MG tablet Take 10 mg by mouth every morning.     ? docusate sodium (COLACE) 100 MG capsule Take 1 capsule (100 mg total) by mouth 2 (two) times daily. (Patient taking differently: Take 100 mg by mouth daily as needed (constipation).) 10 capsule 0   ? esomeprazole (NEXIUM) 20 MG capsule Take 20 mg by mouth daily before breakfast.     ? fluticasone (FLONASE) 50 MCG/ACT nasal spray Place 1 spray into both nostrils every morning.     ? furosemide (LASIX) 20 MG tablet Take 20 mg by mouth every morning.     ? gabapentin (NEURONTIN) 100 MG capsule Take 100 mg by mouth at bedtime.     ? ibuprofen (ADVIL) 600 MG tablet Take 600 mg by mouth every 8 (eight) hours as needed (pain).     ? potassium chloride (KLOR-CON) 10 MEQ tablet Take 10 mEq by mouth every morning.     ? rivaroxaban (XARELTO) 20 MG TABS tablet Take 20 mg by mouth daily after supper.     ? tiZANidine (ZANAFLEX) 2 MG tablet Take 2 mg by mouth at bedtime as needed for muscle spasms.     ? cefadroxil (DURICEF) 500 MG capsule Take 500 mg by mouth 2 (two) times daily. (Patient not taking: Reported on 02/15/2022)   Not Taking  ? ?Allergies  ?Allergen Reactions  ? Ace Inhibitors Other (See Comments)  ?  Other reaction(s): COUGH (tolerates benazepril)  ? Penicillins Nausea And Vomiting and Swelling  ?  Made arms swell, nausea and vomiting.  ?  ?Social History  ? ?Tobacco Use  ? Smoking status: Former  ?  Types: Cigarettes  ?  Quit date: 2000  ?  Years since quitting: 23.2  ? Smokeless tobacco: Never  ?Substance Use Topics  ? Alcohol use: Not Currently  ?  ?No family history on file.  ? ?Review of Systems  ?Constitutional:  Negative for chills and fever.  ?Respiratory:  Negative for cough and shortness of breath.   ?Cardiovascular:  Negative for chest pain.  ?Gastrointestinal:  Negative for nausea and vomiting.  ?Musculoskeletal:  Positive for arthralgias.  ? ? ?Objective: ? ?Physical Exam ?Well nourished and well developed. ?General: Alert and oriented x3, cooperative  and pleasant, no acute distress. ?Head: normocephalic, atraumatic, neck supple. ?Eyes: EOMI. ? ?Musculoskeletal: ?Right knee exam: ?Valgus right knee of about 10 degrees ?Slight flexion contracture ?Passively correctable valgus of the right knee but with pain ?Tenderness mainly over the lateral and anterior aspect the knee ?No significant lower extremity edema or erythema ? ?Calves soft and nontender. Motor function intact in LE. Strength 5/5 LE bilaterally. ?Neuro: Distal pulses 2+. Sensation to light touch intact in LE. ? ?Vital signs in last 24 hours: ?  ? ?Labs: ? ? ?Estimated body mass index is 24.98 kg/m? as calculated from the following: ?  Height as of 02/15/22: '5\' 3"'$  (1.6 m). ?  Weight as of 02/15/22: 64 kg. ? ? ?Imaging Review ?Plain radiographs demonstrate severe degenerative joint disease of the right knee(s). The overall alignment isneutral. The bone quality appears to be adequate for age and reported activity level. ? ? ? ? ? ?Assessment/Plan: ? ?End stage arthritis, right knee  ? ?The patient history, physical examination, clinical judgment of the provider and imaging studies are consistent with end stage degenerative joint disease of the right knee(s) and total knee arthroplasty is deemed medically necessary. The treatment options including medical management, injection therapy arthroscopy and arthroplasty were discussed at length. The risks and benefits of total knee arthroplasty were presented and reviewed. The risks due to aseptic loosening, infection, stiffness, patella tracking problems, thromboembolic complications and other imponderables were discussed. The patient acknowledged the explanation, agreed to proceed with the plan and consent was signed. Patient is being admitted for inpatient treatment for surgery, pain control, PT, OT, prophylactic antibiotics, VTE prophylaxis, progressive ambulation and ADL's and discharge planning. The patient is planning to be discharged  home. ? ?Therapy Plans:  outpatient therapy at [facility] ?Disposition: Home with friend Milbert Coulter) ?Planned DVT Prophylaxis: Xarelto '20mg'$  daily ?DME needed: none ?PCP: Dr. Garnette Scheuermann ?Cardiologist: Dr. Otho Perl  ?TXA: IV ?Allergies: PCN - nausea, swelling ?Anesthesia Concerns: ?BMI: 25 ?Last HgbA1c: Not diabetic ? ? ?Other: ?- Hx of DVT ?- tizanidine ('2mg'$ ), hydrocodone ? ? ?Patient's anticipated LOS is less than 2 midnights, meeting these requirements: ?- Younger than 48 ?- Lives within 1 hour of care ?- Has a competent adult at home to recover with post-op recover ?- NO history of ? - Chronic pain requiring opiods ? - Diabetes ? - Coronary Artery Disease ? - Heart failure ? - Heart attack ? - Stroke ? - DVT/VTE ? - Cardiac arrhythmia ? - Respiratory Failure/COPD ? - Renal failure ? - Anemia ? - Advanced Liver disease ? ?Costella Hatcher, PA-C ?Orthopedic Surgery ?[facility] [place] ?[phone] ? ?

## 2022-02-21 ENCOUNTER — Other Ambulatory Visit: Payer: Self-pay

## 2022-02-21 ENCOUNTER — Encounter (HOSPITAL_COMMUNITY): Payer: Self-pay | Admitting: Orthopedic Surgery

## 2022-02-21 ENCOUNTER — Ambulatory Visit (HOSPITAL_COMMUNITY): Payer: Medicare HMO | Admitting: Anesthesiology

## 2022-02-21 ENCOUNTER — Observation Stay (HOSPITAL_COMMUNITY)
Admission: RE | Admit: 2022-02-21 | Discharge: 2022-02-22 | Disposition: A | Discharge status: Home / Self Care | Payer: Medicare HMO | Attending: Orthopedic Surgery | Admitting: Orthopedic Surgery

## 2022-02-21 ENCOUNTER — Encounter (HOSPITAL_COMMUNITY)
Admission: RE | Disposition: A | Discharge status: Home / Self Care | Payer: Self-pay | Source: Home / Self Care | Attending: Orthopedic Surgery

## 2022-02-21 ENCOUNTER — Ambulatory Visit (HOSPITAL_BASED_OUTPATIENT_CLINIC_OR_DEPARTMENT_OTHER): Payer: Medicare HMO | Admitting: Anesthesiology

## 2022-02-21 DIAGNOSIS — Z87891 Personal history of nicotine dependence: Secondary | ICD-10-CM | POA: Diagnosis not present

## 2022-02-21 DIAGNOSIS — I10 Essential (primary) hypertension: Secondary | ICD-10-CM | POA: Diagnosis not present

## 2022-02-21 DIAGNOSIS — Z79899 Other long term (current) drug therapy: Secondary | ICD-10-CM | POA: Diagnosis not present

## 2022-02-21 DIAGNOSIS — M1711 Unilateral primary osteoarthritis, right knee: Secondary | ICD-10-CM | POA: Diagnosis present

## 2022-02-21 DIAGNOSIS — Z96652 Presence of left artificial knee joint: Secondary | ICD-10-CM | POA: Insufficient documentation

## 2022-02-21 DIAGNOSIS — Z96651 Presence of right artificial knee joint: Secondary | ICD-10-CM

## 2022-02-21 HISTORY — PX: TOTAL KNEE ARTHROPLASTY: SHX125

## 2022-02-21 LAB — TYPE AND SCREEN
ABO/RH(D): AB POS
Antibody Screen: NEGATIVE

## 2022-02-21 SURGERY — ARTHROPLASTY, KNEE, TOTAL
Anesthesia: Spinal | Site: Knee | Laterality: Right

## 2022-02-21 MED ORDER — DEXAMETHASONE SODIUM PHOSPHATE 10 MG/ML IJ SOLN
8.0000 mg | Freq: Once | INTRAMUSCULAR | Status: AC
  Administered 2022-02-21: 8 mg via INTRAVENOUS

## 2022-02-21 MED ORDER — EPHEDRINE SULFATE (PRESSORS) 50 MG/ML IJ SOLN
INTRAMUSCULAR | Status: DC | PRN
  Administered 2022-02-21 (×2): 7 mg via INTRAVENOUS

## 2022-02-21 MED ORDER — PROPOFOL 1000 MG/100ML IV EMUL
INTRAVENOUS | Status: AC
  Filled 2022-02-21: qty 100

## 2022-02-21 MED ORDER — FLUTICASONE PROPIONATE 50 MCG/ACT NA SUSP
1.0000 | Freq: Every morning | NASAL | Status: DC
  Administered 2022-02-22: 1 via NASAL
  Filled 2022-02-21: qty 16

## 2022-02-21 MED ORDER — ONDANSETRON HCL 4 MG PO TABS: 4.0000 mg | ORAL_TABLET | Freq: Four times a day (QID) | ORAL | Status: DC | PRN

## 2022-02-21 MED ORDER — PROPOFOL 10 MG/ML IV BOLUS
INTRAVENOUS | Status: DC | PRN
  Administered 2022-02-21: 100 mg via INTRAVENOUS
  Administered 2022-02-21: 10 mg via INTRAVENOUS

## 2022-02-21 MED ORDER — MORPHINE SULFATE (PF) 2 MG/ML IV SOLN: 0.5000 mg | INTRAVENOUS | Status: DC | PRN

## 2022-02-21 MED ORDER — TRANEXAMIC ACID-NACL 1000-0.7 MG/100ML-% IV SOLN
1000.0000 mg | INTRAVENOUS | Status: AC
  Administered 2022-02-21: 1000 mg via INTRAVENOUS
  Filled 2022-02-21: qty 100

## 2022-02-21 MED ORDER — GABAPENTIN 100 MG PO CAPS
100.0000 mg | ORAL_CAPSULE | Freq: Every day | ORAL | Status: DC
  Administered 2022-02-21: 100 mg via ORAL
  Filled 2022-02-21: qty 1

## 2022-02-21 MED ORDER — FENTANYL CITRATE (PF) 100 MCG/2ML IJ SOLN
INTRAMUSCULAR | Status: DC | PRN
Start: 2022-02-21 — End: 2022-02-21
  Administered 2022-02-21 (×4): 25 ug via INTRAVENOUS

## 2022-02-21 MED ORDER — DIPHENHYDRAMINE HCL 12.5 MG/5ML PO ELIX: 12.5000 mg | ORAL_SOLUTION | ORAL | Status: DC | PRN

## 2022-02-21 MED ORDER — FERROUS SULFATE 325 (65 FE) MG PO TABS
325.0000 mg | ORAL_TABLET | Freq: Three times a day (TID) | ORAL | Status: DC
  Administered 2022-02-22: 325 mg via ORAL
  Filled 2022-02-21: qty 1

## 2022-02-21 MED ORDER — KETOROLAC TROMETHAMINE 30 MG/ML IJ SOLN
INTRAMUSCULAR | Status: AC
  Filled 2022-02-21: qty 1

## 2022-02-21 MED ORDER — BUPIVACAINE-EPINEPHRINE (PF) 0.25% -1:200000 IJ SOLN
INTRAMUSCULAR | Status: DC | PRN
  Administered 2022-02-21: 30 mL

## 2022-02-21 MED ORDER — SODIUM CHLORIDE 0.9 % IV SOLN: INTRAVENOUS | Status: DC

## 2022-02-21 MED ORDER — LATANOPROST 0.005 % OP SOLN
1.0000 [drp] | Freq: Every day | OPHTHALMIC | Status: DC
  Administered 2022-02-21: 1 [drp] via OPHTHALMIC
  Filled 2022-02-21: qty 2.5

## 2022-02-21 MED ORDER — TRANEXAMIC ACID-NACL 1000-0.7 MG/100ML-% IV SOLN
1000.0000 mg | Freq: Once | INTRAVENOUS | Status: AC
  Administered 2022-02-21: 1000 mg via INTRAVENOUS
  Filled 2022-02-21: qty 100

## 2022-02-21 MED ORDER — CEFAZOLIN SODIUM-DEXTROSE 2-4 GM/100ML-% IV SOLN
2.0000 g | Freq: Four times a day (QID) | INTRAVENOUS | Status: AC
  Administered 2022-02-21 (×2): 2 g via INTRAVENOUS
  Filled 2022-02-21 (×2): qty 100

## 2022-02-21 MED ORDER — LACTATED RINGERS IV SOLN: INTRAVENOUS | Status: DC

## 2022-02-21 MED ORDER — DEXAMETHASONE SODIUM PHOSPHATE 10 MG/ML IJ SOLN
INTRAMUSCULAR | Status: AC
  Filled 2022-02-21: qty 1

## 2022-02-21 MED ORDER — SODIUM CHLORIDE 0.9 % IR SOLN
Status: DC | PRN
  Administered 2022-02-21: 1000 mL

## 2022-02-21 MED ORDER — ONDANSETRON HCL 4 MG/2ML IJ SOLN
INTRAMUSCULAR | Status: DC | PRN
  Administered 2022-02-21: 4 mg via INTRAVENOUS

## 2022-02-21 MED ORDER — MIDAZOLAM HCL 2 MG/2ML IJ SOLN
INTRAMUSCULAR | Status: AC
  Filled 2022-02-21: qty 2

## 2022-02-21 MED ORDER — ONDANSETRON HCL 4 MG/2ML IJ SOLN: 4.0000 mg | Freq: Four times a day (QID) | INTRAMUSCULAR | Status: DC | PRN

## 2022-02-21 MED ORDER — FENTANYL CITRATE (PF) 100 MCG/2ML IJ SOLN
INTRAMUSCULAR | Status: AC
  Filled 2022-02-21: qty 2

## 2022-02-21 MED ORDER — POLYVINYL ALCOHOL 1.4 % OP SOLN
1.0000 [drp] | Freq: Three times a day (TID) | OPHTHALMIC | Status: DC | PRN
  Filled 2022-02-21: qty 15

## 2022-02-21 MED ORDER — POTASSIUM CHLORIDE ER 10 MEQ PO TBCR
10.0000 meq | EXTENDED_RELEASE_TABLET | Freq: Every morning | ORAL | Status: DC
  Administered 2022-02-22: 10 meq via ORAL
  Filled 2022-02-21: qty 1

## 2022-02-21 MED ORDER — METOCLOPRAMIDE HCL 5 MG PO TABS: 5.0000 mg | ORAL_TABLET | Freq: Three times a day (TID) | ORAL | Status: DC | PRN

## 2022-02-21 MED ORDER — HYDROCODONE-ACETAMINOPHEN 5-325 MG PO TABS
1.0000 | ORAL_TABLET | ORAL | Status: DC | PRN
  Administered 2022-02-21 – 2022-02-22 (×3): 1 via ORAL
  Filled 2022-02-21 (×3): qty 1

## 2022-02-21 MED ORDER — HYDROCODONE-ACETAMINOPHEN 7.5-325 MG PO TABS: 1.0000 | ORAL_TABLET | ORAL | Status: DC | PRN

## 2022-02-21 MED ORDER — BUPIVACAINE-EPINEPHRINE (PF) 0.25% -1:200000 IJ SOLN
INTRAMUSCULAR | Status: AC
  Filled 2022-02-21: qty 30

## 2022-02-21 MED ORDER — ROPIVACAINE HCL 5 MG/ML IJ SOLN
INTRAMUSCULAR | Status: DC | PRN
  Administered 2022-02-21: 20 mL via PERINEURAL

## 2022-02-21 MED ORDER — CEFAZOLIN SODIUM-DEXTROSE 2-4 GM/100ML-% IV SOLN
2.0000 g | INTRAVENOUS | Status: AC
  Administered 2022-02-21: 2 g via INTRAVENOUS
  Filled 2022-02-21: qty 100

## 2022-02-21 MED ORDER — 0.9 % SODIUM CHLORIDE (POUR BTL) OPTIME
TOPICAL | Status: DC | PRN
  Administered 2022-02-21: 1000 mL

## 2022-02-21 MED ORDER — METHOCARBAMOL 1000 MG/10ML IJ SOLN
500.0000 mg | Freq: Four times a day (QID) | INTRAVENOUS | Status: DC | PRN
  Filled 2022-02-21: qty 5

## 2022-02-21 MED ORDER — METOCLOPRAMIDE HCL 5 MG/ML IJ SOLN: 5.0000 mg | Freq: Three times a day (TID) | INTRAMUSCULAR | Status: DC | PRN

## 2022-02-21 MED ORDER — METHOCARBAMOL 500 MG PO TABS
500.0000 mg | ORAL_TABLET | Freq: Four times a day (QID) | ORAL | Status: DC | PRN
  Administered 2022-02-21 – 2022-02-22 (×3): 500 mg via ORAL
  Filled 2022-02-21 (×3): qty 1

## 2022-02-21 MED ORDER — SODIUM CHLORIDE (PF) 0.9 % IJ SOLN
INTRAMUSCULAR | Status: AC
  Filled 2022-02-21: qty 50

## 2022-02-21 MED ORDER — POVIDONE-IODINE 10 % EX SWAB
2.0000 "application " | Freq: Once | CUTANEOUS | Status: AC
  Administered 2022-02-21: 2 via TOPICAL

## 2022-02-21 MED ORDER — MENTHOL 3 MG MT LOZG: 1.0000 | LOZENGE | OROMUCOSAL | Status: DC | PRN

## 2022-02-21 MED ORDER — LORATADINE 10 MG PO TABS
10.0000 mg | ORAL_TABLET | Freq: Every day | ORAL | Status: DC
  Administered 2022-02-22: 10 mg via ORAL
  Filled 2022-02-21: qty 1

## 2022-02-21 MED ORDER — RIVAROXABAN 10 MG PO TABS
20.0000 mg | ORAL_TABLET | Freq: Every day | ORAL | Status: DC
  Administered 2022-02-22: 20 mg via ORAL
  Filled 2022-02-21: qty 2

## 2022-02-21 MED ORDER — KETOROLAC TROMETHAMINE 30 MG/ML IJ SOLN
INTRAMUSCULAR | Status: DC | PRN
  Administered 2022-02-21: 30 mg

## 2022-02-21 MED ORDER — SODIUM CHLORIDE (PF) 0.9 % IJ SOLN
INTRAMUSCULAR | Status: DC | PRN
  Administered 2022-02-21: 30 mL

## 2022-02-21 MED ORDER — CHLORHEXIDINE GLUCONATE 0.12 % MT SOLN
15.0000 mL | Freq: Once | OROMUCOSAL | Status: AC
  Administered 2022-02-21: 15 mL via OROMUCOSAL

## 2022-02-21 MED ORDER — PHENYLEPHRINE HCL-NACL 20-0.9 MG/250ML-% IV SOLN
INTRAVENOUS | Status: DC | PRN
  Administered 2022-02-21: 30 ug/min via INTRAVENOUS

## 2022-02-21 MED ORDER — ATORVASTATIN CALCIUM 40 MG PO TABS
40.0000 mg | ORAL_TABLET | Freq: Every morning | ORAL | Status: DC
  Administered 2022-02-22: 40 mg via ORAL
  Filled 2022-02-21: qty 1

## 2022-02-21 MED ORDER — BISACODYL 10 MG RE SUPP: 10.0000 mg | Freq: Every day | RECTAL | Status: DC | PRN

## 2022-02-21 MED ORDER — CARBOXYMETHYLCELLULOSE SODIUM 0.5 % OP SOLN: 1.0000 [drp] | Freq: Three times a day (TID) | OPHTHALMIC | Status: DC | PRN

## 2022-02-21 MED ORDER — FUROSEMIDE 20 MG PO TABS: 20.0000 mg | ORAL_TABLET | Freq: Every morning | ORAL | Status: DC

## 2022-02-21 MED ORDER — ACETAMINOPHEN 325 MG PO TABS
325.0000 mg | ORAL_TABLET | Freq: Four times a day (QID) | ORAL | Status: DC | PRN
  Administered 2022-02-22: 650 mg via ORAL
  Filled 2022-02-21: qty 2

## 2022-02-21 MED ORDER — BENAZEPRIL HCL 20 MG PO TABS
40.0000 mg | ORAL_TABLET | Freq: Every day | ORAL | Status: DC
  Administered 2022-02-22: 40 mg via ORAL
  Filled 2022-02-21 (×2): qty 2

## 2022-02-21 MED ORDER — DOCUSATE SODIUM 100 MG PO CAPS
100.0000 mg | ORAL_CAPSULE | Freq: Two times a day (BID) | ORAL | Status: DC
  Administered 2022-02-21 – 2022-02-22 (×2): 100 mg via ORAL
  Filled 2022-02-21 (×2): qty 1

## 2022-02-21 MED ORDER — PHENOL 1.4 % MT LIQD: 1.0000 | OROMUCOSAL | Status: DC | PRN

## 2022-02-21 MED ORDER — PANTOPRAZOLE SODIUM 40 MG PO TBEC
40.0000 mg | DELAYED_RELEASE_TABLET | Freq: Every day | ORAL | Status: DC
  Administered 2022-02-22: 40 mg via ORAL
  Filled 2022-02-21: qty 1

## 2022-02-21 MED ORDER — ORAL CARE MOUTH RINSE: 15.0000 mL | Freq: Once | OROMUCOSAL | Status: AC

## 2022-02-21 MED ORDER — FENTANYL CITRATE PF 50 MCG/ML IJ SOSY
50.0000 ug | PREFILLED_SYRINGE | Freq: Once | INTRAMUSCULAR | Status: AC
  Administered 2022-02-21: 50 ug via INTRAVENOUS
  Filled 2022-02-21: qty 2

## 2022-02-21 MED ORDER — DEXAMETHASONE SODIUM PHOSPHATE 10 MG/ML IJ SOLN
10.0000 mg | Freq: Once | INTRAMUSCULAR | Status: AC
  Administered 2022-02-22: 10 mg via INTRAVENOUS
  Filled 2022-02-21: qty 1

## 2022-02-21 MED ORDER — LIDOCAINE HCL (CARDIAC) PF 100 MG/5ML IV SOSY
PREFILLED_SYRINGE | INTRAVENOUS | Status: DC | PRN
  Administered 2022-02-21: 30 mg via INTRAVENOUS

## 2022-02-21 MED ORDER — AMLODIPINE BESYLATE 5 MG PO TABS
5.0000 mg | ORAL_TABLET | Freq: Every day | ORAL | Status: DC
  Administered 2022-02-22: 5 mg via ORAL
  Filled 2022-02-21: qty 1

## 2022-02-21 MED ORDER — MIDAZOLAM HCL 2 MG/2ML IJ SOLN
1.0000 mg | Freq: Once | INTRAMUSCULAR | Status: AC
  Administered 2022-02-21: 1 mg via INTRAVENOUS
  Filled 2022-02-21: qty 2

## 2022-02-21 MED ORDER — POLYETHYLENE GLYCOL 3350 17 G PO PACK: 17.0000 g | PACK | Freq: Every day | ORAL | Status: DC | PRN

## 2022-02-21 SURGICAL SUPPLY — 51 items
ATTUNE MED ANAT PAT 35 KNEE (Knees) ×1 IMPLANT
ATTUNE PS FEM RT SZ 4 CEM KNEE (Femur) ×1 IMPLANT
ATTUNE PSRP INSR SZ4 5 KNEE (Insert) ×1 IMPLANT
BAG COUNTER SPONGE SURGICOUNT (BAG) IMPLANT
BAG ZIPLOCK 12X15 (MISCELLANEOUS) IMPLANT
BASEPLATE TIBIAL ROTATING SZ 4 (Knees) ×1 IMPLANT
BLADE SAW SGTL 11.0X1.19X90.0M (BLADE) ×1 IMPLANT
BLADE SAW SGTL 13.0X1.19X90.0M (BLADE) ×2 IMPLANT
BLADE SURG SZ10 CARB STEEL (BLADE) ×4 IMPLANT
BNDG ELASTIC 6X5.8 VLCR STR LF (GAUZE/BANDAGES/DRESSINGS) ×2 IMPLANT
BOWL SMART MIX CTS (DISPOSABLE) ×2 IMPLANT
CEMENT HV SMART SET (Cement) ×2 IMPLANT
CUFF TOURN SGL QUICK 34 (TOURNIQUET CUFF) ×1
CUFF TRNQT CYL 34X4.125X (TOURNIQUET CUFF) ×1 IMPLANT
DERMABOND ADVANCED (GAUZE/BANDAGES/DRESSINGS) ×1
DERMABOND ADVANCED .7 DNX12 (GAUZE/BANDAGES/DRESSINGS) ×1 IMPLANT
DRAPE INCISE IOBAN 66X45 STRL (DRAPES) ×2 IMPLANT
DRAPE U-SHAPE 47X51 STRL (DRAPES) ×2 IMPLANT
DRESSING AQUACEL AG SP 3.5X10 (GAUZE/BANDAGES/DRESSINGS) ×1 IMPLANT
DRSG AQUACEL AG SP 3.5X10 (GAUZE/BANDAGES/DRESSINGS) ×2
DURAPREP 26ML APPLICATOR (WOUND CARE) ×4 IMPLANT
ELECT REM PT RETURN 15FT ADLT (MISCELLANEOUS) ×2 IMPLANT
GLOVE SURG ENC MOIS LTX SZ6 (GLOVE) ×2 IMPLANT
GLOVE SURG ENC MOIS LTX SZ7 (GLOVE) ×2 IMPLANT
GLOVE SURG UNDER LTX SZ6.5 (GLOVE) ×2 IMPLANT
GLOVE SURG UNDER POLY LF SZ7.5 (GLOVE) ×2 IMPLANT
GOWN STRL REUS W/ TWL LRG LVL3 (GOWN DISPOSABLE) ×1 IMPLANT
GOWN STRL REUS W/TWL LRG LVL3 (GOWN DISPOSABLE) ×1
HANDPIECE INTERPULSE COAX TIP (DISPOSABLE) ×1
HOLDER FOLEY CATH W/STRAP (MISCELLANEOUS) IMPLANT
KIT TURNOVER KIT A (KITS) IMPLANT
MANIFOLD NEPTUNE II (INSTRUMENTS) ×2 IMPLANT
NDL SAFETY ECLIPSE 18X1.5 (NEEDLE) IMPLANT
NEEDLE HYPO 18GX1.5 SHARP (NEEDLE)
NS IRRIG 1000ML POUR BTL (IV SOLUTION) ×2 IMPLANT
PACK TOTAL KNEE CUSTOM (KITS) ×2 IMPLANT
PROTECTOR NERVE ULNAR (MISCELLANEOUS) ×2 IMPLANT
SET HNDPC FAN SPRY TIP SCT (DISPOSABLE) ×1 IMPLANT
SET PAD KNEE POSITIONER (MISCELLANEOUS) ×2 IMPLANT
SPIKE FLUID TRANSFER (MISCELLANEOUS) ×4 IMPLANT
SUT MNCRL AB 4-0 PS2 18 (SUTURE) ×2 IMPLANT
SUT STRATAFIX PDS+ 0 24IN (SUTURE) ×2 IMPLANT
SUT VIC AB 1 CT1 36 (SUTURE) ×2 IMPLANT
SUT VIC AB 2-0 CT1 27 (SUTURE) ×2
SUT VIC AB 2-0 CT1 TAPERPNT 27 (SUTURE) ×2 IMPLANT
SYR 3ML LL SCALE MARK (SYRINGE) ×2 IMPLANT
TRAY FOLEY MTR SLVR 14FR STAT (SET/KITS/TRAYS/PACK) IMPLANT
TRAY FOLEY MTR SLVR 16FR STAT (SET/KITS/TRAYS/PACK) ×1 IMPLANT
TUBE SUCTION HIGH CAP CLEAR NV (SUCTIONS) ×2 IMPLANT
WATER STERILE IRR 1000ML POUR (IV SOLUTION) ×4 IMPLANT
WRAP KNEE MAXI GEL POST OP (GAUZE/BANDAGES/DRESSINGS) ×2 IMPLANT

## 2022-02-21 NOTE — Anesthesia Postprocedure Evaluation (Signed)
Anesthesia Post Note ? ?Patient: Danielle Tate ? ?Procedure(s) Performed: TOTAL KNEE ARTHROPLASTY (Right: Knee) ? ?  ? ?Patient location during evaluation: PACU ?Anesthesia Type: Spinal ?Level of consciousness: awake and alert ?Pain management: pain level controlled ?Vital Signs Assessment: post-procedure vital signs reviewed and stable ?Respiratory status: spontaneous breathing, nonlabored ventilation and respiratory function stable ?Cardiovascular status: blood pressure returned to baseline and stable ?Postop Assessment: no apparent nausea or vomiting ?Anesthetic complications: no ? ? ?No notable events documented. ? ?Last Vitals:  ?Vitals:  ? 02/21/22 1630 02/21/22 1654  ?BP:  (!) 143/75  ?Pulse: 74 72  ?Resp: 10 17  ?Temp:  36.5 ?C  ?SpO2: 98% 94%  ?  ?Last Pain:  ?Vitals:  ? 02/21/22 1654  ?TempSrc: Oral  ?PainSc: 4   ? ? ?  ?  ?  ?  ?  ?  ? ?Lynda Rainwater ? ? ? ? ?

## 2022-02-21 NOTE — Progress Notes (Signed)
CBC is a medically necessary test prior to undergoing joint replacement surgery. It is pertinent to know pre-operative hemoglobin for surgical risk assessment and planning.  ?

## 2022-02-21 NOTE — Anesthesia Preprocedure Evaluation (Signed)
Anesthesia Evaluation  ?Patient identified by MRN, date of birth, ID band ?Patient awake ? ? ? ?Reviewed: ?Allergy & Precautions, NPO status , Patient's Chart, lab work & pertinent test results ? ?History of Anesthesia Complications ?Negative for: history of anesthetic complications ? ?Airway ?Mallampati: II ? ?TM Distance: >3 FB ?Neck ROM: Full ? ? ? Dental ? ?(+) Dental Advisory Given, Partial Lower, Partial Upper ?  ?Pulmonary ?former smoker,  ?  ?breath sounds clear to auscultation ? ? ? ? ? ? Cardiovascular ?hypertension, Pt. on medications ?(-) angina ?Rhythm:Regular Rate:Normal ? ? ?  ?Neuro/Psych ?negative neurological ROS ? negative psych ROS  ? GI/Hepatic ?Neg liver ROS, GERD  Medicated and Controlled,  ?Endo/Other  ? ?Obesity ? ? Renal/GU ?negative Renal ROS  ? ?  ?Musculoskeletal ? ?(+) Arthritis , Osteoarthritis,   ? Abdominal ?  ?Peds ? Hematology ?negative hematology ROS ?(+)   ?Anesthesia Other Findings ?Anaphylaxis to penicillin ? ? Reproductive/Obstetrics ? ?  ? ? ? ? ? ? ? ? ? ? ? ? ? ?  ?  ? ? ? ? ? ? ? ? ?Anesthesia Physical ? ?Anesthesia Plan ? ?ASA: II ? ?Anesthesia Plan: Spinal  ? ?Post-op Pain Management: Regional block* and Minimal or no pain anticipated  ? ?Induction: Intravenous ? ?PONV Risk Score and Plan: 2 and Treatment may vary due to age or medical condition and Propofol infusion ? ?Airway Management Planned: Natural Airway and Simple Face Mask ? ?Additional Equipment: None ? ?Intra-op Plan:  ? ?Post-operative Plan:  ? ?Informed Consent: I have reviewed the patients History and Physical, chart, labs and discussed the procedure including the risks, benefits and alternatives for the proposed anesthesia with the patient or authorized representative who has indicated his/her understanding and acceptance.  ? ? ? ? ? ?Plan Discussed with: CRNA and Anesthesiologist ? ?Anesthesia Plan Comments: (Labs reviewed, platelets acceptable. Discussed risks and  benefits of spinal, including spinal/epidural hematoma, infection, failed block, and PDPH. Patient expressed understanding and wished to proceed. )  ? ? ? ? ? ? ?Anesthesia Quick Evaluation ? ?

## 2022-02-21 NOTE — Anesthesia Procedure Notes (Signed)
Procedure Name: LMA Insertion ?Date/Time: 02/21/2022 1:11 PM ?Performed by: Garrel Ridgel, CRNA ?Pre-anesthesia Checklist: Patient identified, Emergency Drugs available, Suction available and Patient being monitored ?Patient Re-evaluated:Patient Re-evaluated prior to induction ?Oxygen Delivery Method: Circle system utilized ?Preoxygenation: Pre-oxygenation with 100% oxygen ?Induction Type: IV induction ?Ventilation: Mask ventilation without difficulty ?LMA Size: 4.0 ?Number of attempts: 1 ?Placement Confirmation: positive ETCO2 ?Tube secured with: Tape ?Dental Injury: Teeth and Oropharynx as per pre-operative assessment  ? ? ? ? ?

## 2022-02-21 NOTE — Interval H&P Note (Signed)
History and Physical Interval Note: ? ?02/21/2022 ?11:37 AM ? ?Danielle Tate  has presented today for surgery, with the diagnosis of Right knee osteoarthritis.  The various methods of treatment have been discussed with the patient and family. After consideration of risks, benefits and other options for treatment, the patient has consented to  Procedure(s): ?TOTAL KNEE ARTHROPLASTY (Right) as a surgical intervention.  The patient's history has been reviewed, patient examined, no change in status, stable for surgery.  I have reviewed the patient's chart and labs.  Questions were answered to the patient's satisfaction.   ? ? ?Mauri Pole ? ? ?

## 2022-02-21 NOTE — Discharge Instructions (Signed)

## 2022-02-21 NOTE — Plan of Care (Signed)

## 2022-02-21 NOTE — Op Note (Signed)
NAME:  Danielle Tate                  ?   ? MEDICAL RECORD NO.:  [id number]  ?   ?                        FACILITY:  [facility]  ?   ? PHYSICIAN:  Danielle Tate, M.D.  DATE OF BIRTH:  1941/09/26  ?   ? DATE OF PROCEDURE:  02/21/2022   ?  ?   ?                             OPERATIVE REPORT  ?   ?   ? PREOPERATIVE DIAGNOSIS:  Right knee osteoarthritis.  ?   ? POSTOPERATIVE DIAGNOSIS:  Right knee osteoarthritis.  ?   ? FINDINGS:  The patient was noted to have complete loss of cartilage and  ? bone-on-bone arthritis with associated osteophytes in the lateral and patellofemoral compartments of  ? the knee with a significant synovitis and associated effusion.  The patient had failed months of conservative treatment including medications, injection therapy, activity modification. ?   ? PROCEDURE:  Right total knee replacement.  ?   ? COMPONENTS USED:  DePuy Attune rotating platform posterior stabilized knee  ? system, a size 4 femur, 4 tibia, size 5 mm PS AOX insert, and 35 anatomic patellar  ? button.  ?   ? SURGEON:  Danielle Tate, M.D.  ?   ? ASSISTANT:  Danielle Hatcher, PA-C.  ?   ? ANESTHESIA:  General and Regional.  ?   ? SPECIMENS:  None.  ?   ? COMPLICATION:  None.  ?   ? DRAINS:  None. ? ?EBL: <100 cc  ?   ? TOURNIQUET TIME:   ?Total Tourniquet Time Documented: ?Thigh (Right) - 28 minutes ?Total: Thigh (Right) - 28 minutes ? .  ?   ? The patient was stable to the recovery room.  ?   ? INDICATION FOR PROCEDURE:  Danielle Tate is a 81 y.o. female patient of  ? mine.  The patient had been seen, evaluated, and treated for months conservatively in the  ? office with medication, activity modification, and injections.  The patient had  ? radiographic changes of bone-on-bone arthritis with endplate sclerosis and osteophytes noted.  Based on the radiographic changes and failed conservative measures, the patient  ? decided to proceed with definitive treatment, total knee replacement.  Risks of infection, DVT, component failure,  need for revision surgery, neurovascular injury were reviewed in the office setting.  The postop course was reviewed stressing the efforts to maximize post-operative satisfaction and function.  Consent was obtained for benefit of pain  ? relief.  ?   ? PROCEDURE IN DETAIL:  The patient was brought to the operative theater.  ? Once adequate anesthesia, preoperative antibiotics, 2 gm of Ancef,1 gm of Tranexamic Acid, and 10 mg of Decadron administered, the patient was positioned supine with a right thigh tourniquet placed.  The  ?right lower extremity was prepped and draped in sterile fashion.  A time-  ? out was performed identifying the patient, planned procedure, and the appropriate extremity.  ?   ? The right lower extremity was placed in the [facility] leg holder.  The leg was  ? exsanguinated, tourniquet elevated to 250 mmHg.  A midline incision was  ? made followed  by median parapatellar arthrotomy.  Following initial  ? exposure, attention was first directed to the patella.  Precut  ? measurement was noted to be 22 mm.  I resected down to 13 mm and used a  ? 35 anatomic patellar button to restore patellar height as well as cover the cut surface.  ?   ? The lug holes were drilled and a metal shim was placed to protect the  ? patella from retractors and saw blade during the procedure.  ?   ? At this point, attention was now directed to the femur.  The femoral  ? canal was opened with a drill, irrigated to try to prevent fat emboli.  An  ? intramedullary rod was passed at 3 degrees valgus, 10 mm of bone was  ? resected off the distal femur.  Following this resection, the tibia was  ? subluxated anteriorly.  Using the extramedullary guide, 2 mm of bone was resected off  ? the proximal lateral tibia.  We confirmed the gap would be  ? stable medially and laterally with a size 5 spacer block as well as confirmed that the tibial cut was perpendicular in the coronal plane, checking with an alignment rod.  ?   ? Once this  was done, I sized the femur to be a size 4 in the anterior-  ? posterior dimension, chose a standard component based on medial and  ? lateral dimension.  The size 4 rotation block was then pinned in  ? position anterior referenced using the C-clamp to set rotation.  The  ? anterior, posterior, and  chamfer cuts were made without difficulty nor  ? notching making certain that I was along the anterior cortex to help  ? with flexion gap stability.  ?   ? The final box cut was made off the lateral aspect of distal femur.  ?   ? At this point, the tibia was sized to be a size 4.  The size 4 tray was  ? then pinned in position through the medial third of the tubercle,  ? drilled, and keel punched.  Trial reduction was now carried with a 4 femur, ? 4 tibia, a size 5 mm PS insert, and the 35 anatomic patella botton.  The knee was brought to full extension with good flexion stability with the patella  ? tracking through the trochlea without application of pressure.  Given  ? all these findings the trial components removed.  Final components were  ? opened and cement was mixed.  The knee was irrigated with normal saline solution and pulse lavage.  The synovial lining was  ? then injected with 30 cc of 0.25% Marcaine with epinephrine, 1 cc of Toradol and 30 cc of NS for a total of 61 cc.  ?   ?Final implants were then cemented onto cleaned and dried cut surfaces of bone with the knee brought to extension with a size 5 mm PS trial insert.  ?   ? Once the cement had fully cured, excess cement was removed  ? throughout the knee.  I confirmed that I was satisfied with the range of  ? motion and stability, and the final size 5 mm PS AOX insert was chosen.  It was  ? placed into the knee.  ?   ? The tourniquet had been let down at 28 minutes.  No significant  ? hemostasis was required.  The extensor mechanism was then reapproximated using #1 Vicryl and #1  Stratafix sutures with the knee  ? in flexion.  The  ? remaining wound was  closed with 2-0 Vicryl and running 4-0 Monocryl.  ? The knee was cleaned, dried, dressed sterilely using Dermabond and  ? Aquacel dressing.  The patient was then  ? brought to recovery room in stable condition, tolerating the procedure  ? well.  ? ?Please note that Physician Assistant, Danielle Hatcher, PA-C was present for the entirety of the case, and was utilized for pre-operative positioning, peri-operative retractor management, general facilitation of the procedure and for primary wound closure at the end of the case. ?   ?   ?   ?   ? Danielle Cassis Alvan Tate, M.D.  ? ? ?02/21/2022 2:17 PM  ?

## 2022-02-21 NOTE — Transfer of Care (Signed)
Immediate Anesthesia Transfer of Care Note ? ?Patient: Danielle Tate ? ?Procedure(s) Performed: TOTAL KNEE ARTHROPLASTY (Right: Knee) ? ?Patient Location: PACU ? ?Anesthesia Type:General ? ?Level of Consciousness: awake, drowsy and patient cooperative ? ?Airway & Oxygen Therapy: Patient Spontanous Breathing and Patient connected to face mask oxygen ? ?Post-op Assessment: Report given to RN and Post -op Vital signs reviewed and stable ? ?Post vital signs: Reviewed and stable ? ?Last Vitals:  ?Vitals Value Taken Time  ?BP    ?Temp    ?Pulse    ?Resp    ?SpO2    ? ? ?Last Pain:  ?Vitals:  ? 02/21/22 1138  ?TempSrc:   ?PainSc: 0-No pain  ?   ? ?  ? ?Complications: No notable events documented. ?

## 2022-02-21 NOTE — Evaluation (Signed)
Physical Therapy Evaluation ?Patient Details ?Name: Danielle Tate ?MRN: [id number] ?DOB: 1941/08/23 ?Today's Date: 02/21/2022 ? ?History of Present Illness ? Pt is an 81yo female presenting s/p R-TKA on 02/21/22. PMH: OA, HTN, L-THA 2020.  ?Clinical Impression ? Danielle Tate is a 81 y.o. female POD 0 s/p R-TKA. Patient reports modified independence with mobility at baseline. Patient is now limited by functional impairments (see PT problem list below) and requires min guard for transfers. Pt demonstrated some fear of weightbearing on RLE but demonstrated appropriate BUE support with RW during transfers, refused additional mobility today. Patient instructed in exercise to facilitate ROM and circulation to manage edema. Patient will benefit from continued skilled PT interventions to address impairments and progress towards PLOF. Acute PT will follow to progress mobility in preparation for safe discharge home.   ?   ? ?Recommendations for follow up therapy are one component of a multi-disciplinary discharge planning process, led by the attending physician.  Recommendations may be updated based on patient status, additional functional criteria and insurance authorization. ? ?Follow Up Recommendations Follow physician's recommendations for discharge plan and follow up therapies ? ?  ?Assistance Recommended at Discharge Set up Supervision/Assistance  ?Patient can return home with the following ? A little help with walking and/or transfers;A little help with bathing/dressing/bathroom;Assistance with cooking/housework;Assist for transportation ? ?  ?Equipment Recommendations None recommended by PT (Pt has recommended DME)  ?Recommendations for Other Services ?    ?  ?Functional Status Assessment Patient has had a recent decline in their functional status and demonstrates the ability to make significant improvements in function in a reasonable and predictable amount of time.  ? ?  ?Precautions / Restrictions  Precautions ?Precautions: Fall ?Restrictions ?Weight Bearing Restrictions: Yes ?RLE Weight Bearing: Weight bearing as tolerated  ? ?  ? ?Mobility ? Bed Mobility ?Overal bed mobility: Needs Assistance ?Bed Mobility: Supine to Sit ?  ?  ?Supine to sit: Min guard ?  ?  ?General bed mobility comments: Pt min guard for bed mobility, no physical assist required. VC for sequencing. ?  ? ?Transfers ?Overall transfer level: Needs assistance ?Equipment used: Rolling walker (2 wheels) ?Transfers: Sit to/from Stand, Bed to chair/wheelchair/BSC ?Sit to Stand: Min guard ?  ?Step pivot transfers: Min guard ?  ?  ?  ?General transfer comment: Pt min guard for safety and line management only, no physical assist required. Multimodal cuing for sequencing and hand placement. ?  ? ?Ambulation/Gait ?  ?  ?  ?  ?  ?  ?  ?General Gait Details: deferred ? ?Stairs ?  ?  ?  ?  ?  ? ?Wheelchair Mobility ?  ? ?Modified Rankin (Stroke Patients Only) ?  ? ?  ? ?Balance Overall balance assessment: Needs assistance ?Sitting-balance support: Feet supported, No upper extremity supported ?Sitting balance-Leahy Scale: Fair ?  ?  ?Standing balance support: Bilateral upper extremity supported, During functional activity, Reliant on assistive device for balance ?Standing balance-Leahy Scale: Poor ?  ?  ?  ?  ?  ?  ?  ?  ?  ?  ?  ?  ?   ? ? ? ?Pertinent Vitals/Pain Pain Assessment ?Pain Assessment: 0-10 ?Pain Score: 5  ?Pain Location: R knee ?Pain Descriptors / Indicators: Operative site guarding, Discomfort ?Pain Intervention(s): Limited activity within patient's tolerance, Monitored during session, Repositioned, Ice applied  ? ? ?Home Living Family/patient expects to be discharged to:: Private residence ?Living Arrangements: Non-relatives/Friends Milbert Coulter and Sonia Side) ?Available Help at Discharge: Friend(s);Available 24  hours/day ?Type of Home: House ?Home Access: Level entry ?  ?  ?  ?Home Layout: One level ?Home Equipment: Conservation officer, nature (2 wheels);Cane -  single point ?   ?  ?Prior Function Prior Level of Function : Independent/Modified Independent ?  ?  ?  ?  ?  ?  ?Mobility Comments: Uses RW for mobility ?ADLs Comments: Ind ?  ? ? ?Hand Dominance  ? Dominant Hand: Right ? ?  ?Extremity/Trunk Assessment  ? Upper Extremity Assessment ?Upper Extremity Assessment: Overall WFL for tasks assessed ?  ? ?Lower Extremity Assessment ?Lower Extremity Assessment: RLE deficits/detail;LLE deficits/detail ?RLE Deficits / Details: MMT: ank df/pf 5/5, no extensor lag noted ?RLE Sensation: WNL ?LLE Deficits / Details: MMT: ank df/pf 5/5 ?LLE Sensation: WNL ?  ? ?Cervical / Trunk Assessment ?Cervical / Trunk Assessment: Normal  ?Communication  ? Communication: No difficulties  ?Cognition Arousal/Alertness: Awake/alert ?Behavior During Therapy: [facility] for tasks assessed/performed ?Overall Cognitive Status: Within Functional Limits for tasks assessed ?  ?  ?  ?  ?  ?  ?  ?  ?  ?  ?  ?  ?  ?  ?  ?  ?  ?  ?  ? ?  ?General Comments   ? ?  ?Exercises Total Joint Exercises ?Ankle Circles/Pumps: AROM, Both, 20 reps ?Quad Sets: AROM, Right, 5 reps  ? ?Assessment/Plan  ?  ?PT Assessment Patient needs continued PT services  ?PT Problem List Decreased strength;Decreased range of motion;Decreased activity tolerance;Decreased balance;Decreased mobility;Decreased cognition;Decreased knowledge of use of DME;Pain ? ?   ?  ?PT Treatment Interventions DME instruction;Gait training;Stair training;Functional mobility training;Therapeutic activities;Therapeutic exercise;Balance training;Neuromuscular re-education;Patient/family education   ? ?PT Goals (Current goals can be found in the Care Plan section)  ?Acute Rehab PT Goals ?Patient Stated Goal: Walk without RW ?PT Goal Formulation: With patient ?Time For Goal Achievement: 02/28/22 ?Potential to Achieve Goals: Good ? ?  ?Frequency 7X/week ?  ? ? ?Co-evaluation   ?  ?  ?  ?  ? ? ?  ?AM-PAC PT "6 Clicks" Mobility  ?Outcome Measure Help needed turning from  your back to your side while in a flat bed without using bedrails?: None ?Help needed moving from lying on your back to sitting on the side of a flat bed without using bedrails?: A Little ?Help needed moving to and from a bed to a chair (including a wheelchair)?: A Little ?Help needed standing up from a chair using your arms (e.g., wheelchair or bedside chair)?: A Little ?Help needed to walk in hospital room?: A Little ?Help needed climbing 3-5 steps with a railing? : A Little ?6 Click Score: 19 ? ?  ?End of Session Equipment Utilized During Treatment: Gait belt ?Activity Tolerance: Patient tolerated treatment well ?Patient left: in chair;with call bell/phone within reach;with chair alarm set;with family/visitor present ?Nurse Communication: Mobility status ?PT Visit Diagnosis: Pain;Difficulty in walking, not elsewhere classified (R26.2) ?Pain - Right/Left: Right ?Pain - part of body: Knee ?  ? ?Time: 5885-0277 ?PT Time Calculation (min) (ACUTE ONLY): 34 min ? ? ?Charges:   PT Evaluation ?$PT Eval Low Complexity: 1 Low ?PT Treatments ?$Therapeutic Activity: 8-22 mins ?  ?   ? ? ?Coolidge Breeze, PT, DPT ?[facility] Department ?Office: [phone] ?Pager: [phone] ? ?Coolidge Breeze ?02/21/2022, 7:10 PM ? ?

## 2022-02-21 NOTE — Progress Notes (Signed)
Assisted Dr. Candida Peeling with Right Knee Adductor Canal block. Side rails up, monitors on throughout procedure. See vital signs in flow sheet. Tolerated Procedure well. ? ?

## 2022-02-21 NOTE — Anesthesia Procedure Notes (Signed)
Anesthesia Regional Block: Adductor canal block  ? ?Pre-Anesthetic Checklist: , timeout performed,  Correct Patient, Correct Site, Correct Laterality,  Correct Procedure, Correct Position, site marked,  Risks and benefits discussed,  Surgical consent,  Pre-op evaluation,  At surgeon's request and post-op pain management ? ?Laterality: Right ? ?Prep: chloraprep     ?  ?Needles:  ?Injection technique: Single-shot ? ?Needle Type: Stimiplex   ? ? ?Needle Length: 9cm  ?Needle Gauge: 21  ? ? ? ?Additional Needles: ? ? ?Procedures:,,,, ultrasound used (permanent image in chart),,    ?Narrative:  ?Start time: 02/21/2022 11:58 AM ?End time: 02/21/2022 12:03 PM ?Injection made incrementally with aspirations every 5 mL. ? ?Performed by: Personally  ?Anesthesiologist: Lynda Rainwater, MD ? ? ? ? ?

## 2022-02-22 ENCOUNTER — Encounter (HOSPITAL_COMMUNITY): Payer: Self-pay | Admitting: Orthopedic Surgery

## 2022-02-22 DIAGNOSIS — M1711 Unilateral primary osteoarthritis, right knee: Secondary | ICD-10-CM | POA: Diagnosis not present

## 2022-02-22 LAB — CBC
HCT: 30.2 % — ABNORMAL LOW (ref 36.0–46.0)
Hemoglobin: 9.4 g/dL — ABNORMAL LOW (ref 12.0–15.0)
MCH: 25.3 pg — ABNORMAL LOW (ref 26.0–34.0)
MCHC: 31.1 g/dL (ref 30.0–36.0)
MCV: 81.4 fL (ref 80.0–100.0)
Platelets: 241 10*3/uL (ref 150–400)
RBC: 3.71 MIL/uL — ABNORMAL LOW (ref 3.87–5.11)
RDW: 17.3 % — ABNORMAL HIGH (ref 11.5–15.5)
WBC: 9.7 10*3/uL (ref 4.0–10.5)
nRBC: 0 % (ref 0.0–0.2)

## 2022-02-22 LAB — BASIC METABOLIC PANEL
Anion gap: 7 (ref 5–15)
BUN: 23 mg/dL (ref 8–23)
CO2: 25 mmol/L (ref 22–32)
Calcium: 8.1 mg/dL — ABNORMAL LOW (ref 8.9–10.3)
Chloride: 108 mmol/L (ref 98–111)
Creatinine, Ser: 0.95 mg/dL (ref 0.44–1.00)
GFR, Estimated: >60 mL/min (ref >60)
Glucose, Bld: 165 mg/dL — ABNORMAL HIGH (ref 70–99)
Potassium: 3.7 mmol/L (ref 3.5–5.1)
Sodium: 140 mmol/L (ref 135–145)

## 2022-02-22 MED ORDER — TIZANIDINE HCL 2 MG PO TABS
2.0000 mg | ORAL_TABLET | Freq: Three times a day (TID) | ORAL | 0 refills | Status: AC | PRN
Start: 2022-02-22 — End: ?

## 2022-02-22 MED ORDER — POLYETHYLENE GLYCOL 3350 17 G PO PACK: 17.0000 g | PACK | Freq: Every day | ORAL | 0 refills | Status: AC | PRN

## 2022-02-22 MED ORDER — HYDROCODONE-ACETAMINOPHEN 5-325 MG PO TABS: 1.0000 | ORAL_TABLET | Freq: Four times a day (QID) | ORAL | 0 refills | Status: AC | PRN

## 2022-02-22 NOTE — Plan of Care (Signed)
  Problem: Coping: Goal: Level of anxiety will decrease Outcome: Progressing   Problem: Pain Managment: Goal: General experience of comfort will improve Outcome: Progressing   

## 2022-02-22 NOTE — Plan of Care (Signed)
?  Problem: Pain Managment: ?Goal: General experience of comfort will improve ?02/22/2022 0458 by Blase Mess, RN ?Outcome: Progressing ?02/22/2022 0457 by Blase Mess, RN ?Outcome: Progressing ?  ?Problem: Coping: ?Goal: Level of anxiety will decrease ?02/22/2022 0458 by Blase Mess, RN ?Outcome: Progressing ?02/22/2022 0457 by Blase Mess, RN ?Outcome: Progressing ?  ?Problem: Safety: ?Goal: Ability to remain free from injury will improve ?02/22/2022 0458 by Blase Mess, RN ?Outcome: Progressing ?02/22/2022 0457 by Blase Mess, RN ?Outcome: Progressing ?  ?

## 2022-02-22 NOTE — Progress Notes (Signed)
Pt is ready for discharge.  PIV removed.  Remains alert and oriented.  AVS given.  Pt able to verbalize understanding of all discharge instructions, including wound care.  All questions answered. ?

## 2022-02-22 NOTE — TOC Transition Note (Signed)
Transition of Care (TOC) - CM/SW Discharge Note ? ? ?Patient Details  ?Name: Danielle Tate ?MRN: [id number] ?Date of Birth: 10/28/1941 ? ?Transition of Care (TOC) CM/SW Contact:  ?Jadalynn Burr, LCSW ?Phone Number: ?02/22/2022, 1:03 PM ? ? ?Clinical Narrative:    ? ?Met briefly and confirming that pt has all needed DME at home.  OPPT already set up in [facility].  No TOC needs. ? ?Final next level of care: OP Rehab ?Barriers to Discharge: No Barriers Identified ? ? ?Patient Goals and CMS Choice ?Patient states their goals for this hospitalization and ongoing recovery are:: return home ?  ?  ? ?Discharge Placement ?  ?           ?  ?  ?  ?  ? ?Discharge Plan and Services ?  ?  ?           ?DME Arranged: N/A ?DME Agency: NA ?  ?  ?  ?  ?  ?  ?  ?  ? ?Social Determinants of Health (SDOH) Interventions ?  ? ? ?Readmission Risk Interventions ?   ? View : No data to display.  ?  ?  ?  ? ? ? ? ? ?

## 2022-02-22 NOTE — Progress Notes (Signed)
? ?  Subjective: ?1 Day Post-Op Procedure(s) (LRB): ?TOTAL KNEE ARTHROPLASTY (Right) ?Patient reports pain as mild.   ?Patient seen in rounds by Dr. Alvan Dame. ?Patient is well, and has had no acute complaints or problems. No acute events overnight. Foley catheter removed. Patient ambulated a few feet with PT.  ?We will continue therapy today.  ? ?Objective: ?Vital signs in last 24 hours: ?Temp:  [97.7 ?F (36.5 ?C)-98.2 ?F (36.8 ?C)] 98.2 ?F (36.8 ?C) (04/05 3729) ?Pulse Rate:  [67-81] 75 (04/05 0618) ?Resp:  [10-20] 20 (04/05 0618) ?BP: (114-161)/(61-91) 114/63 (04/05 0618) ?SpO2:  [94 %-100 %] 100 % (04/05 0618) ?Weight:  [64 kg] 64 kg (04/04 1700) ? ?Intake/Output from previous day: ? ?Intake/Output Summary (Last 24 hours) at 02/22/2022 0715 ?Last data filed at 02/22/2022 0200 ?Gross per 24 hour  ?Intake 2533.01 ml  ?Output 340 ml  ?Net 2193.01 ml  ?  ? ?Intake/Output this shift: ?No intake/output data recorded. ? ?Labs: ?Recent Labs  ?  02/22/22 ?0255  ?HGB 9.4*  ? ?Recent Labs  ?  02/22/22 ?0255  ?WBC 9.7  ?RBC 3.71*  ?HCT 30.2*  ?PLT 241  ? ?Recent Labs  ?  02/22/22 ?0255  ?NA 140  ?K 3.7  ?CL 108  ?CO2 25  ?BUN 23  ?CREATININE 0.95  ?GLUCOSE 165*  ?CALCIUM 8.1*  ? ?No results for input(s): LABPT, INR in the last 72 hours. ? ?Exam: ?General - Patient is Alert and Oriented ?Extremity - Neurologically intact ?Sensation intact distally ?Intact pulses distally ?Dorsiflexion/Plantar flexion intact ?Dressing - dressing C/D/I ?Motor Function - intact, moving foot and toes well on exam.  ? ?Past Medical History:  ?Diagnosis Date  ? Arthritis   ? Hypertension   ? ? ?Assessment/Plan: ?1 Day Post-Op Procedure(s) (LRB): ?TOTAL KNEE ARTHROPLASTY (Right) ?Principal Problem: ?  S/P total knee arthroplasty, right ? ?Estimated body mass index is 24.98 kg/m? as calculated from the following: ?  Height as of this encounter: '5\' 3"'$  (1.6 m). ?  Weight as of this encounter: 64 kg. ?Advance diet ?Up with therapy ?D/C IV fluids ? ? ?Patient's  anticipated LOS is less than 2 midnights, meeting these requirements: ?- Younger than 51 ?- Lives within 1 hour of care ?- Has a competent adult at home to recover with post-op recover ?- NO history of ? - Chronic pain requiring opiods ? - Diabetes ? - Coronary Artery Disease ? - Heart failure ? - Heart attack ? - Stroke ? - DVT/VTE ? - Cardiac arrhythmia ? - Respiratory Failure/COPD ? - Renal failure ? - Anemia ? - Advanced Liver disease ? ?  ? ?DVT Prophylaxis - Xarelto ?Weight bearing as tolerated. ? ?Hgb stable at 9.4 this AM. ? ?Plan is to go Home after hospital stay. Plan for discharge today following 1-2 sessions of PT as long as they are meeting their goals. Patient is scheduled for OPPT. Follow up in the office in 2 weeks.  ? ?Griffith Citron, PA-C ?Orthopedic Surgery ?[phone] ?02/22/2022, 7:15 AM  ?

## 2022-02-22 NOTE — Progress Notes (Signed)
Physical Therapy Treatment ?Patient Details ?Name: Danielle Tate ?MRN: [id number] ?DOB: 28-Jun-1941 ?Today's Date: 02/22/2022 ? ? ?History of Present Illness Pt is an 81yo female presenting s/p R-TKA on 02/21/22. PMH: OA, HTN, L-THA 2020. ? ?  ?PT Comments  ? ? Patient making progress . Will practice steps next visit.   ?Recommendations for follow up therapy are one component of a multi-disciplinary discharge planning process, led by the attending physician.  Recommendations may be updated based on patient status, additional functional criteria and insurance authorization. ? ?Follow Up Recommendations ? Follow physician's recommendations for discharge plan and follow up therapies ?  ?  ?Assistance Recommended at Discharge Set up Supervision/Assistance  ?Patient can return home with the following A little help with walking and/or transfers;A little help with bathing/dressing/bathroom;Assistance with cooking/housework;Assist for transportation ?  ?Equipment Recommendations ? None recommended by PT  ?  ?Recommendations for Other Services   ? ? ?  ?Precautions / Restrictions Precautions ?Precautions: Fall;Knee ?Restrictions ?RLE Weight Bearing: Weight bearing as tolerated  ?  ? ?Mobility ? Bed Mobility ?  ?Bed Mobility: Supine to Sit ?  ?  ?Supine to sit: Min guard ?  ?  ?General bed mobility comments: Pt min guard for bed mobility, no physical assist required. VC for sequencing. ?  ? ?Transfers ?Overall transfer level: Needs assistance ?Equipment used: Rolling walker (2 wheels) ?Transfers: Sit to/from Stand ?Sit to Stand: Min assist, Min guard ?  ?  ?  ?  ?  ?General transfer comment: Patien lost balance first stand up, after that, she demonstrated control to stand up. ?  ? ?Ambulation/Gait ?Ambulation/Gait assistance: Min guard, Min assist ?Gait Distance (Feet): 20 Feet (then 50') ?Assistive device: Rolling walker (2 wheels) ?Gait Pattern/deviations: Step-to pattern, Step-through pattern ?  ?  ?  ?General Gait Details: cues  for sequence ? ? ?Stairs ?  ?  ?  ?  ?  ? ? ?Wheelchair Mobility ?  ? ?Modified Rankin (Stroke Patients Only) ?  ? ? ?  ?Balance   ?Sitting-balance support: Feet supported, No upper extremity supported ?Sitting balance-Leahy Scale: Normal ?  ?  ?Standing balance support: Bilateral upper extremity supported, During functional activity, Reliant on assistive device for balance ?Standing balance-Leahy Scale: Fair ?  ?  ?  ?  ?  ?  ?  ?  ?  ?  ?  ?  ?  ? ?  ?Cognition Arousal/Alertness: Awake/alert ?  ?  ?  ?  ?  ?  ?  ?  ?  ?  ?  ?  ?  ?  ?  ?  ?  ?  ?  ?  ?  ? ?  ?Exercises   ? ?  ?General Comments   ?  ?  ? ?Pertinent Vitals/Pain Pain Assessment ?Pain Score: 3  ?Pain Location: R knee ?Pain Descriptors / Indicators: Operative site guarding, Discomfort ?Pain Intervention(s): Monitored during session, Premedicated before session  ? ? ?Home Living   ?  ?  ?  ?  ?  ?  ?  ?  ?  ?   ?  ?Prior Function    ?  ?  ?   ? ?PT Goals (current goals can now be found in the care plan section) Progress towards PT goals: Progressing toward goals ? ?  ?Frequency ? ? ? 7X/week ? ? ? ?  ?PT Plan Current plan remains appropriate  ? ? ?Co-evaluation   ?  ?  ?  ?  ? ?  ?  AM-PAC PT "6 Clicks" Mobility   ?Outcome Measure ? Help needed turning from your back to your side while in a flat bed without using bedrails?: None ?Help needed moving from lying on your back to sitting on the side of a flat bed without using bedrails?: A Little ?Help needed moving to and from a bed to a chair (including a wheelchair)?: A Little ?Help needed standing up from a chair using your arms (e.g., wheelchair or bedside chair)?: A Little ?Help needed to walk in hospital room?: A Little ?Help needed climbing 3-5 steps with a railing? : A Little ?6 Click Score: 19 ? ?  ?End of Session Equipment Utilized During Treatment: Gait belt ?Activity Tolerance: Patient tolerated treatment well ?Patient left: in chair;with call bell/phone within reach ?Nurse Communication: Mobility  status ?Pain - Right/Left: Right ?Pain - part of body: Knee ?  ? ? ?Time: 1000-1025 ?PT Time Calculation (min) (ACUTE ONLY): 25 min ? ?Charges:  $Gait Training: 23-37 mins          ?          ? ?Tresa Endo PT ?Acute Rehabilitation Services ?Pager [phone] ?Office [phone] ? ? ? ?Danielle Tate ?02/22/2022, 1:07 PM ? ?

## 2022-02-22 NOTE — Progress Notes (Signed)
Physical Therapy Treatment ?Patient Details ?Name: Danielle Tate ?MRN: [id number] ?DOB: 1941/04/10 ?Today's Date: 02/22/2022 ? ? ?History of Present Illness Pt is an 81yo female presenting s/p R-TKA on 02/21/22. PMH: OA, HTN, L-THA 2020. ? ?  ?PT Comments  ? ? Patient has met PT goals for Dc.   ?Recommendations for follow up therapy are one component of a multi-disciplinary discharge planning process, led by the attending physician.  Recommendations may be updated based on patient status, additional functional criteria and insurance authorization. ? ?Follow Up Recommendations ? Follow physician's recommendations for discharge plan and follow up therapies ?  ?  ?Assistance Recommended at Discharge Set up Supervision/Assistance  ?Patient can return home with the following A little help with walking and/or transfers;A little help with bathing/dressing/bathroom;Assistance with cooking/housework;Assist for transportation ?  ?Equipment Recommendations ? None recommended by PT  ?  ?Recommendations for Other Services   ? ? ?  ?Precautions / Restrictions Precautions ?Precautions: Fall;Knee ?Restrictions ?RLE Weight Bearing: Weight bearing as tolerated  ?  ? ?Mobility ? Bed Mobility ?  ?  ?General bed mobility comments: in recliner ?  ? ?Transfers ?Overall transfer level: Needs assistance ?Equipment used: Rolling walker (2 wheels) ?Transfers: Sit to/from Stand ?Sit to Stand: Min guard ?  ?  ?  ?  ?  ?General transfer comment: cues for  right leg position ?  ? ?Ambulation/Gait ?Ambulation/Gait assistance: Min guard ?Gait Distance (Feet): 50 Feet ?Assistive device: Rolling walker (2 wheels) ?Gait Pattern/deviations: Step-to pattern, Step-through pattern ?  ?  ?  ?General Gait Details: cues for sequence ? ? ?Stairs ?Stairs: Yes ?Stairs assistance: Min assist ?Stair Management: Two rails, Forwards ?Number of Stairs: 2 ?General stair comments: patient demonstrating using RW going forward with rail also. Redirected patient  to use Rails  not RW. ? ? ?Wheelchair Mobility ?  ? ?Modified Rankin (Stroke Patients Only) ?  ? ? ?  ?Balance   ?Sitting-balance support: Feet supported, No upper extremity supported ?Sitting balance-Leahy Scale: Normal ?  ?  ?Standing balance support: Bilateral upper extremity supported, During functional activity, Reliant on assistive device for balance ?Standing balance-Leahy Scale: Fair ?  ?  ?  ?  ?  ?  ?  ?  ?  ?  ?  ?  ?  ? ?  ?Cognition Arousal/Alertness: Awake/alert ?  ?  ?  ?  ?  ?  ?  ?  ?  ?  ?  ?  ?  ?  ?  ?  ?  ?  ?  ?  ?  ? ?  ?Exercises Total Joint Exercises ?Ankle Circles/Pumps: AROM, Both, 10 reps ?Quad Sets: AROM, Both, 10 reps ?Hip ABduction/ADduction: AROM, Right, 10 reps ?Straight Leg Raises: AROM, Right, 10 reps ?Long Arc Quad: AROM, Right, 10 reps ?Knee Flexion: AROM, Right, 10 reps ? ?  ?General Comments   ?  ?  ? ?Pertinent Vitals/Pain Pain Assessment ?Pain Score: 3  ?Pain Location: R knee ?Pain Descriptors / Indicators: Operative site guarding, Discomfort ?Pain Intervention(s): Monitored during session, Premedicated before session, Ice applied  ? ? ?Home Living   ?  ?  ?  ?  ?  ?  ?  ?  ?  ?   ?  ?Prior Function    ?  ?  ?   ? ?PT Goals (current goals can now be found in the care plan section) Progress towards PT goals: Progressing toward goals ? ?  ?Frequency ? ? ? 7X/week ? ? ? ?  ?  PT Plan Current plan remains appropriate  ? ? ?Co-evaluation   ?  ?  ?  ?  ? ?  ?AM-PAC PT "6 Clicks" Mobility   ?Outcome Measure ? Help needed turning from your back to your side while in a flat bed without using bedrails?: None ?Help needed moving from lying on your back to sitting on the side of a flat bed without using bedrails?: None ?Help needed moving to and from a bed to a chair (including a wheelchair)?: A Little ?Help needed standing up from a chair using your arms (e.g., wheelchair or bedside chair)?: A Little ?Help needed to walk in hospital room?: A Little ?Help needed climbing 3-5 steps with a railing? : A  Little ?6 Click Score: 20 ? ?  ?End of Session Equipment Utilized During Treatment: Gait belt ?Activity Tolerance: Patient tolerated treatment well ?Patient left: in chair;with call bell/phone within reach;with chair alarm set ?Nurse Communication: Mobility status ?PT Visit Diagnosis: Pain;Difficulty in walking, not elsewhere classified (R26.2) ?Pain - Right/Left: Right ?Pain - part of body: Knee ?  ? ? ?Time: 4196-2229 ?PT Time Calculation (min) (ACUTE ONLY): 11 min ? ?Charges:  $Gait Training: 8-22 mins          ?          ? ?Tresa Endo PT ?Acute Rehabilitation Services ?Pager [phone] ?Office [phone] ? ? ? ?Claretha Cooper ?02/22/2022, 1:11 PM ? ?

## 2022-02-22 NOTE — Plan of Care (Signed)
°  Problem: Education: °Goal: Knowledge of General Education information will improve °Description: Including pain rating scale, medication(s)/side effects and non-pharmacologic comfort measures °Outcome: Progressing °  °Problem: Health Behavior/Discharge Planning: °Goal: Ability to manage health-related needs will improve °Outcome: Progressing °  °Problem: Clinical Measurements: °Goal: Ability to maintain clinical measurements within normal limits will improve °Outcome: Progressing °Goal: Will remain free from infection °Outcome: Progressing °Goal: Diagnostic test results will improve °Outcome: Progressing °Goal: Respiratory complications will improve °Outcome: Progressing °Goal: Cardiovascular complication will be avoided °Outcome: Progressing °  °Problem: Activity: °Goal: Risk for activity intolerance will decrease °Outcome: Progressing °  °Problem: Nutrition: °Goal: Adequate nutrition will be maintained °Outcome: Progressing °  °Problem: Coping: °Goal: Level of anxiety will decrease °Outcome: Progressing °  °Problem: Elimination: °Goal: Will not experience complications related to bowel motility °Outcome: Progressing °Goal: Will not experience complications related to urinary retention °Outcome: Progressing °  °Problem: Pain Managment: °Goal: General experience of comfort will improve °Outcome: Progressing °  °Problem: Safety: °Goal: Ability to remain free from injury will improve °Outcome: Progressing °  °Problem: Skin Integrity: °Goal: Risk for impaired skin integrity will decrease °Outcome: Progressing °  °Problem: Education: °Goal: Knowledge of the prescribed therapeutic regimen will improve °Outcome: Progressing °Goal: Individualized Educational Video(s) °Outcome: Progressing °  °Problem: Activity: °Goal: Ability to avoid complications of mobility impairment will improve °Outcome: Progressing °Goal: Range of joint motion will improve °Outcome: Progressing °  °Problem: Clinical Measurements: °Goal:  Postoperative complications will be avoided or minimized °Outcome: Progressing °  °Problem: Pain Management: °Goal: Pain level will decrease with appropriate interventions °Outcome: Progressing °  °Problem: Skin Integrity: °Goal: Will show signs of wound healing °Outcome: Progressing °  °Problem: Education: °Goal: Knowledge of the prescribed therapeutic regimen will improve °Outcome: Progressing °Goal: Individualized Educational Video(s) °Outcome: Progressing °  °Problem: Activity: °Goal: Ability to avoid complications of mobility impairment will improve °Outcome: Progressing °Goal: Range of joint motion will improve °Outcome: Progressing °  °Problem: Clinical Measurements: °Goal: Postoperative complications will be avoided or minimized °Outcome: Progressing °  °Problem: Pain Management: °Goal: Pain level will decrease with appropriate interventions °Outcome: Progressing °  °Problem: Skin Integrity: °Goal: Will show signs of wound healing °Outcome: Progressing °  °

## 2022-03-02 NOTE — Discharge Summary (Signed)
Patient ID: ?Danielle Tate ?MRN: [id number] ?DOB/AGE: Jun 18, 1941 81 y.o. ? ?Admit date: 02/21/2022 ?Discharge date: 02/22/2022 ? ?Admission Diagnoses:  ?Right knee osteoarthritis ? ?Discharge Diagnoses:  ?Principal Problem: ?  S/P total knee arthroplasty, right ? ? ?Past Medical History:  ?Diagnosis Date  ? Arthritis   ? Hypertension   ? ? ?Surgeries: Procedure(s): ?TOTAL KNEE ARTHROPLASTY on 02/21/2022 ?  ?Consultants:  ? ?Discharged Condition: Improved ? ?Hospital Course: Sapir Lavey is an 81 y.o. female who was admitted 02/21/2022 for operative treatment ofS/P total knee arthroplasty, right. Patient has severe unremitting pain that affects sleep, daily activities, and work/hobbies. After pre-op clearance the patient was taken to the operating room on 02/21/2022 and underwent  Procedure(s): ?TOTAL KNEE ARTHROPLASTY.   ? ?Patient was given perioperative antibiotics:  ?Anti-infectives (From admission, onward)  ? ? Start     Dose/Rate Route Frequency Ordered Stop  ? 02/21/22 1830  ceFAZolin (ANCEF) IVPB 2g/100 mL premix       ? 2 g ?200 mL/hr over 30 Minutes Intravenous Every 6 hours 02/21/22 1654 02/22/22 0752  ? 02/21/22 1045  ceFAZolin (ANCEF) IVPB 2g/100 mL premix       ? 2 g ?200 mL/hr over 30 Minutes Intravenous On call to O.R. 02/21/22 1035 02/21/22 1314  ? ?  ?  ? ?Patient was given sequential compression devices, early ambulation, and chemoprophylaxis to prevent DVT. Patient worked with PT and was meeting their goals regarding safe ambulation and transfers. ? ?Patient benefited maximally from hospital stay and there were no complications.   ? ?Recent vital signs: No data found.  ? ?Recent laboratory studies: No results for input(s): WBC, HGB, HCT, PLT, NA, K, CL, CO2, BUN, CREATININE, GLUCOSE, INR, CALCIUM in the last 72 hours. ? ?Invalid input(s): PT, 2 ? ? ?Discharge Medications:   ?Allergies as of 02/22/2022   ? ?   Reactions  ? Ace Inhibitors Other (See Comments)  ? Other reaction(s): COUGH (tolerates benazepril)   ? Penicillins Nausea And Vomiting, Swelling  ? Made arms swell, nausea and vomiting. ?Tolerated Cephalosporin 02/21/22  ? ?  ? ?  ?Medication List  ?  ? ?STOP taking these medications   ? ?cefadroxil 500 MG capsule ?Commonly known as: DURICEF ?  ?ibuprofen 600 MG tablet ?Commonly known as: ADVIL ?  ? ?  ? ?TAKE these medications   ? ?amLODipine-benazepril 5-40 MG capsule ?Commonly known as: LOTREL ?Take 1 capsule by mouth every morning. ?  ?atorvastatin 40 MG tablet ?Commonly known as: LIPITOR ?Take 40 mg by mouth every morning. ?  ?bimatoprost 0.01 % Soln ?Commonly known as: LUMIGAN ?Place 1 drop into both eyes at bedtime. ?  ?carboxymethylcellulose 0.5 % Soln ?Commonly known as: REFRESH PLUS ?Place 1 drop into both eyes 3 (three) times daily as needed (eye drops). ?  ?cetirizine 10 MG tablet ?Commonly known as: ZYRTEC ?Take 10 mg by mouth every morning. ?  ?docusate sodium 100 MG capsule ?Commonly known as: Colace ?Take 1 capsule (100 mg total) by mouth 2 (two) times daily. ?What changed:  ?when to take this ?reasons to take this ?  ?esomeprazole 20 MG capsule ?Commonly known as: Dowagiac ?Take 20 mg by mouth daily before breakfast. ?  ?fluticasone 50 MCG/ACT nasal spray ?Commonly known as: FLONASE ?Place 1 spray into both nostrils every morning. ?  ?furosemide 20 MG tablet ?Commonly known as: LASIX ?Take 20 mg by mouth every morning. ?  ?gabapentin 100 MG capsule ?Commonly known as: NEURONTIN ?Take 100 mg by mouth  at bedtime. ?  ?HYDROcodone-acetaminophen 5-325 MG tablet ?Commonly known as: NORCO/VICODIN ?Take 1-2 tablets by mouth every 6 (six) hours as needed for severe pain. ?  ?polyethylene glycol 17 g packet ?Commonly known as: MIRALAX / GLYCOLAX ?Take 17 g by mouth daily as needed for mild constipation. ?  ?potassium chloride 10 MEQ tablet ?Commonly known as: KLOR-CON ?Take 10 mEq by mouth every morning. ?  ?rivaroxaban 20 MG Tabs tablet ?Commonly known as: XARELTO ?Take 20 mg by mouth daily after supper. ?   ?tiZANidine 2 MG tablet ?Commonly known as: ZANAFLEX ?Take 1 tablet (2 mg total) by mouth every 8 (eight) hours as needed for muscle spasms. ?What changed: when to take this ?  ? ?  ? ?  ?  ? ? ?  ?Discharge Care Instructions  ?(From admission, onward)  ?  ? ? ?  ? ?  Start     Ordered  ? 02/22/22 0000  Change dressing       ?Comments: Maintain surgical dressing until follow up in the clinic. If the edges start to pull up, may reinforce with tape. If the dressing is no longer working, may remove and cover with gauze and tape, but must keep the area dry and clean.  Call with any questions or concerns.  ? 02/22/22 0720  ? ?  ?  ? ?  ? ? ?Diagnostic Studies: No results found. ? ?Disposition: Discharge disposition: 01-Home or Self Care ? ? ? ? ? ? ?Discharge Instructions   ? ? Call MD / Call 911   Complete by: As directed ?  ? If you experience chest pain or shortness of breath, CALL 911 and be transported to the hospital emergency room.  If you develope a fever above 101 F, pus (white drainage) or increased drainage or redness at the wound, or calf pain, call your surgeon's office.  ? Change dressing   Complete by: As directed ?  ? Maintain surgical dressing until follow up in the clinic. If the edges start to pull up, may reinforce with tape. If the dressing is no longer working, may remove and cover with gauze and tape, but must keep the area dry and clean.  Call with any questions or concerns.  ? Constipation Prevention   Complete by: As directed ?  ? Drink plenty of fluids.  Prune juice may be helpful.  You may use a stool softener, such as Colace (over the counter) 100 mg twice a day.  Use MiraLax (over the counter) for constipation as needed.  ? Diet - low sodium heart healthy   Complete by: As directed ?  ? Increase activity slowly as tolerated   Complete by: As directed ?  ? Weight bearing as tolerated with assist device (walker, cane, etc) as directed, use it as long as suggested by your surgeon or therapist,  typically at least 4-6 weeks.  ? Post-operative opioid taper instructions:   Complete by: As directed ?  ? POST-OPERATIVE OPIOID TAPER INSTRUCTIONS: ?It is important to wean off of your opioid medication as soon as possible. If you do not need pain medication after your surgery it is ok to stop day one. ?Opioids include: ?Codeine, Hydrocodone(Norco, Vicodin), Oxycodone(Percocet, oxycontin) and hydromorphone amongst others.  ?Long term and even short term use of opiods can cause: ?Increased pain response ?Dependence ?Constipation ?Depression ?Respiratory depression ?And more.  ?Withdrawal symptoms can include ?Flu like symptoms ?Nausea, vomiting ?And more ?Techniques to manage these symptoms ?Hydrate well ?Eat regular  healthy meals ?Stay active ?Use relaxation techniques(deep breathing, meditating, yoga) ?Do Not substitute Alcohol to help with tapering ?If you have been on opioids for less than two weeks and do not have pain than it is ok to stop all together.  ?Plan to wean off of opioids ?This plan should start within one week post op of your joint replacement. ?Maintain the same interval or time between taking each dose and first decrease the dose.  ?Cut the total daily intake of opioids by one tablet each day ?Next start to increase the time between doses. ?The last dose that should be eliminated is the evening dose.  ? ?  ? TED hose   Complete by: As directed ?  ? Use stockings (TED hose) for 2 weeks on both leg(s).  You may remove them at night for sleeping.  ? ?  ? ? ? Follow-up Information   ? ? Paralee Cancel, MD. Schedule an appointment as soon as possible for a visit in 2 week(s).   ?Specialty: Orthopedic Surgery ?Contact information: ?[address] ?[address] ?[place] [address] ?[phone] ? ? ?  ?  ? ?  ?  ? ?  ? ? ? ?Signed: ?Irving Copas ?03/02/2022, 2:13 PM ? ? ? ?

## 2022-12-04 IMAGING — DX DG SHOULDER 2+V*R*
2 series · 2 of 2 positions shown · non-contrast
Comparison: None.

CLINICAL DATA: Fall striking right shoulder today [REDACTED].
Pain posterior right shoulder.

EXAM:
RIGHT SHOULDER - 2+ VIEW

[shoulder grashey]
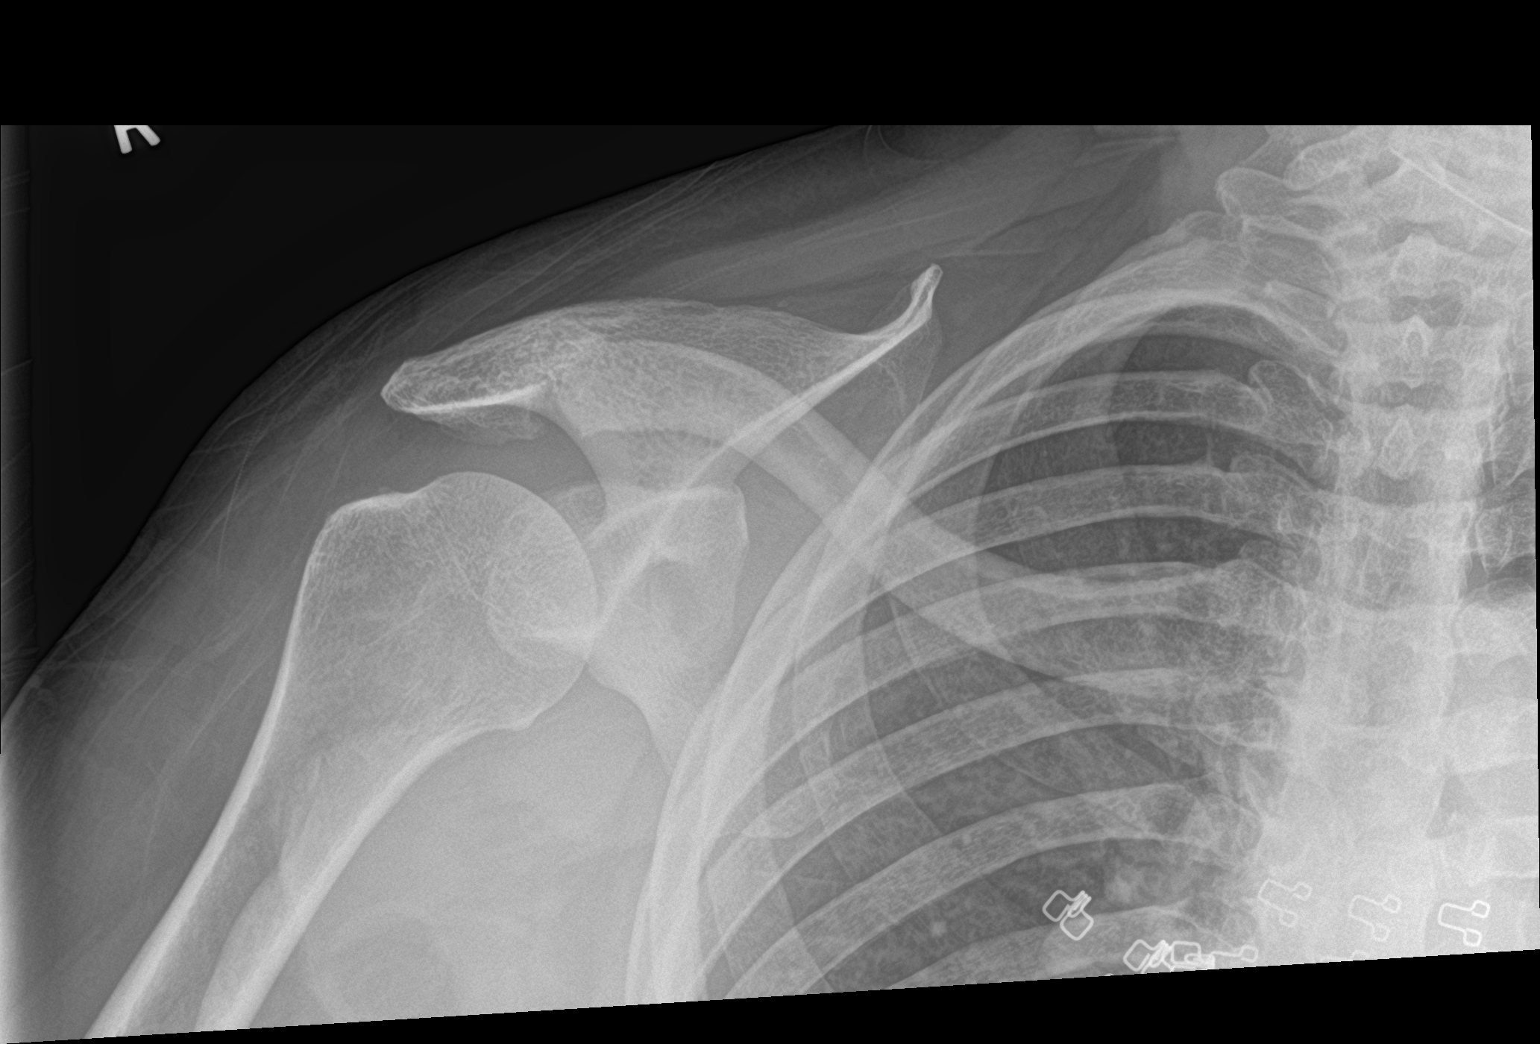

[shoulder y view]
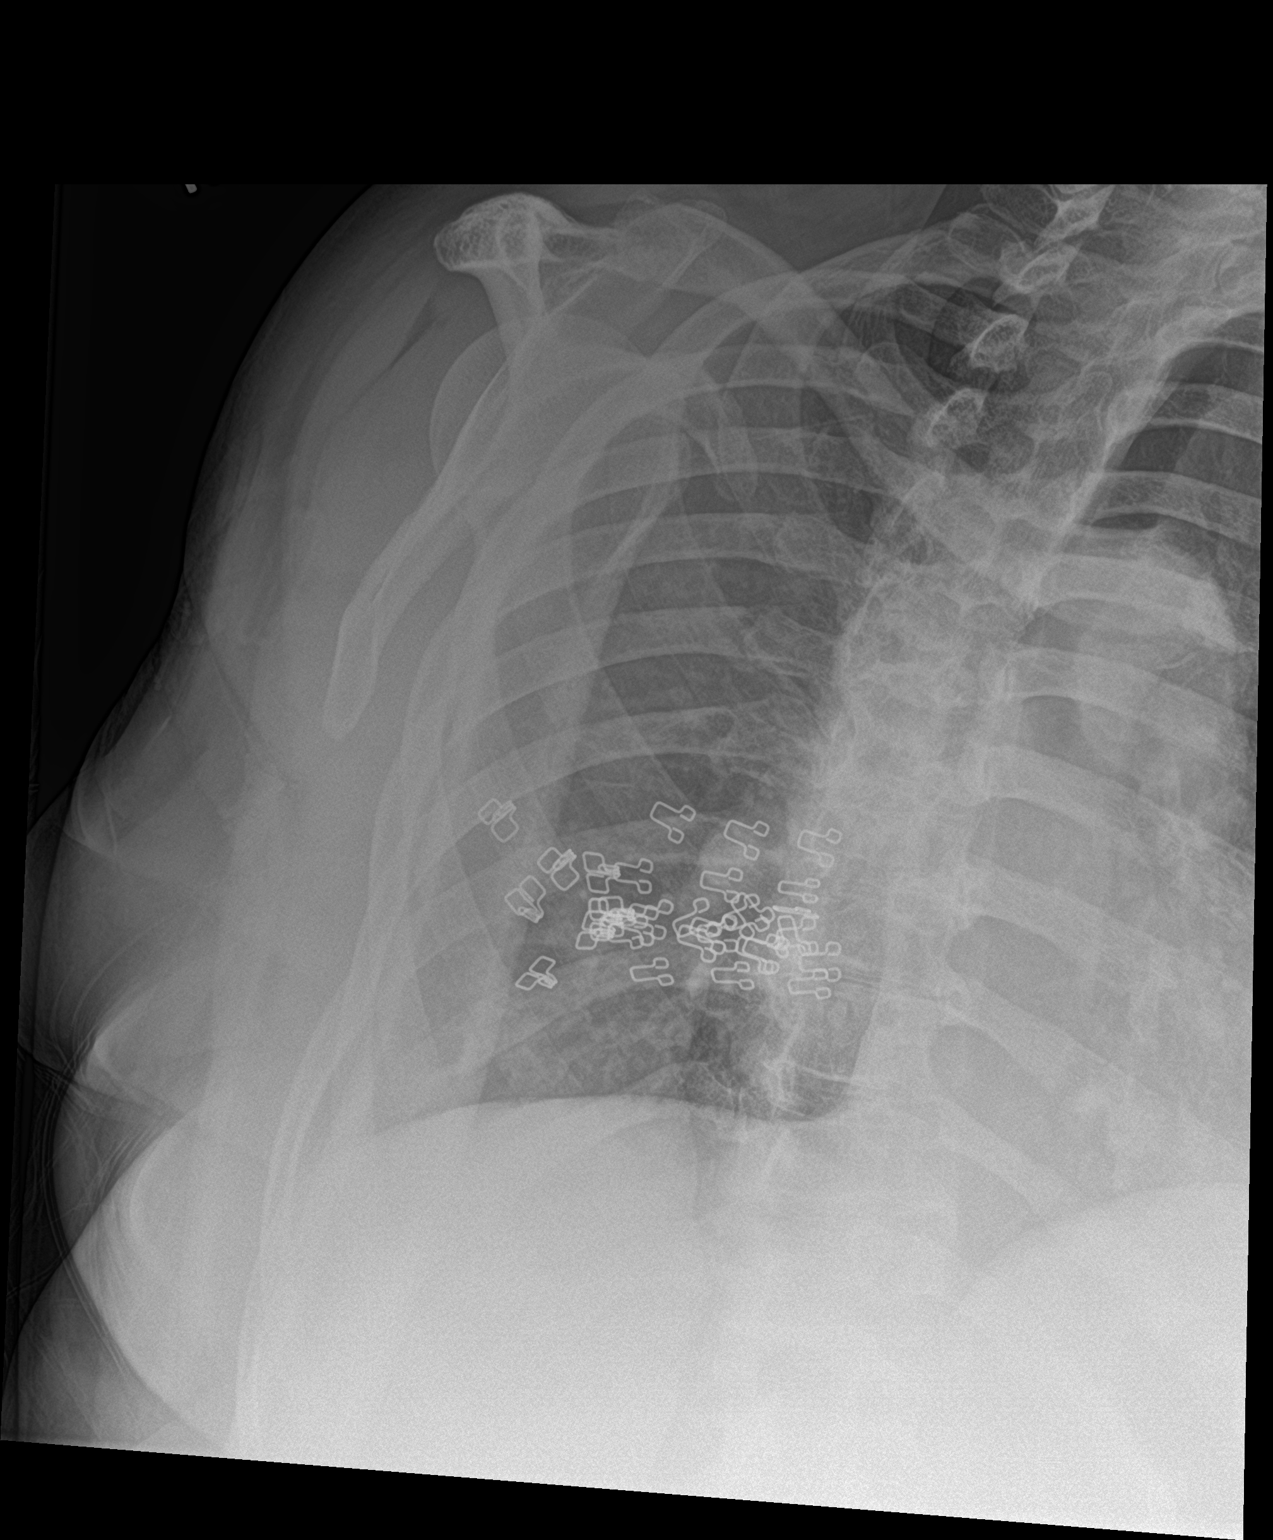

[2 of 2 positions shown; findings below may reference images not displayed]

FINDINGS: There is no evidence of fracture or dislocation. Minor
acromioclavicular degenerative spurring. Glenohumeral joint not well
assessed on provided views. Soft tissues are unremarkable. No
abnormality in the included portion of the thorax.
IMPRESSION: No fracture or subluxation of the right shoulder.

## 2022-12-04 IMAGING — CT CT CERVICAL SPINE W/O CM
3 of 4 series · 13 of 33 positions shown, 16 images · non-contrast
Comparison: None.

CLINICAL DATA: Fell, neck trauma

EXAM:
CT CERVICAL SPINE WITHOUT CONTRAST
TECHNIQUE: Multidetector CT imaging of the cervical spine was performed without
intravenous contrast. Multiplanar CT image reconstructions were also
generated.

[Series 3: c_spine 2.0 i30s 3 · axial · 0.31mm/px · z∈[-381,-293]mm · 5 of 67 slices shown, 7 images]
[im 12/67  soft-tissue]
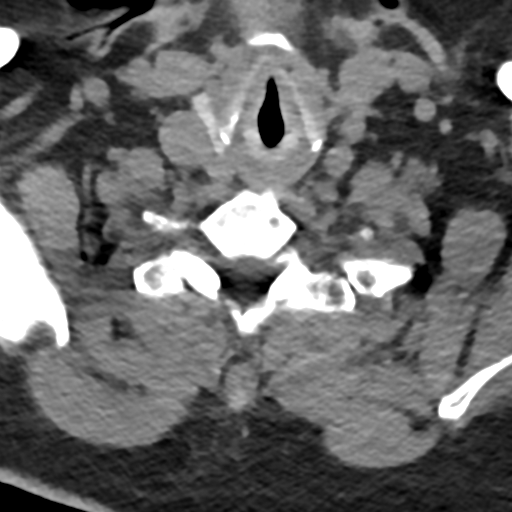
[im 12/67  bone]
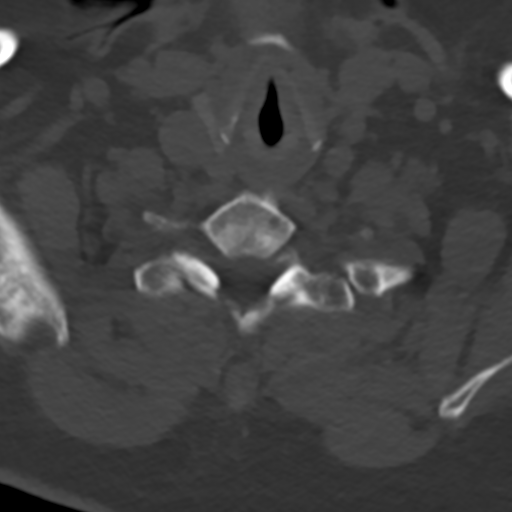
[im 23/67  bone]
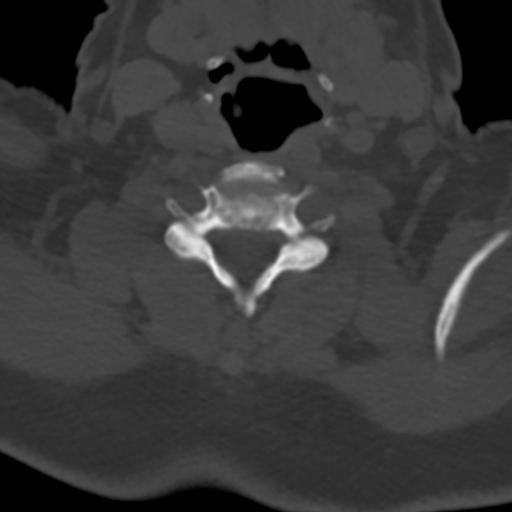
[im 34/67  bone]
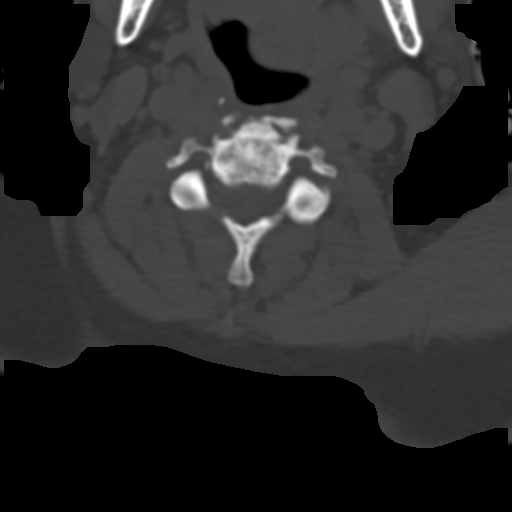
[im 45/67  bone]
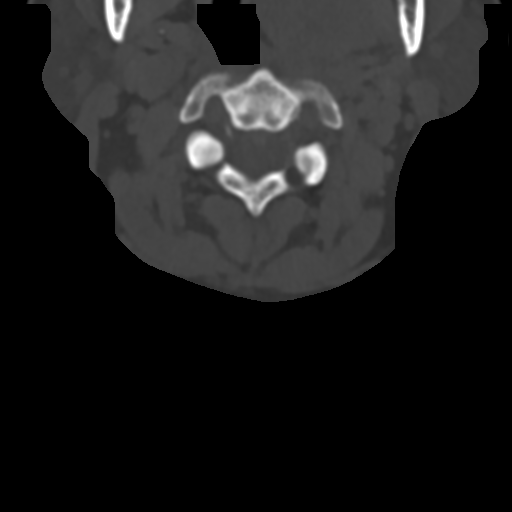
[im 56/67  soft-tissue]
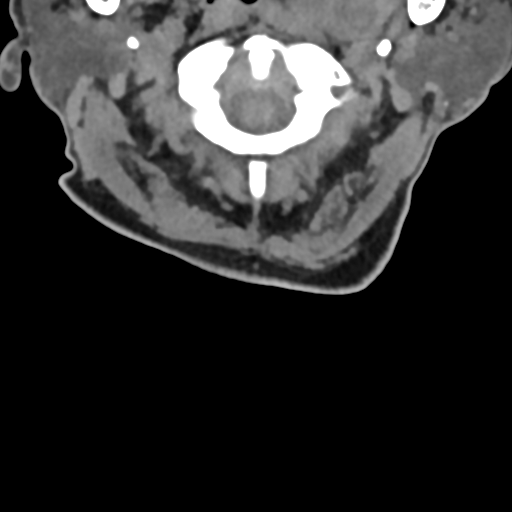
[im 56/67  bone]
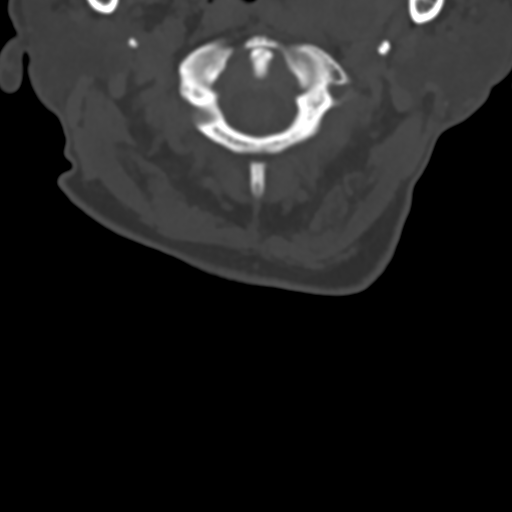

[Series 5: coronals · coronal · 0.23mm/px · 3 of 48 slices shown]
[im 10/48  bone]
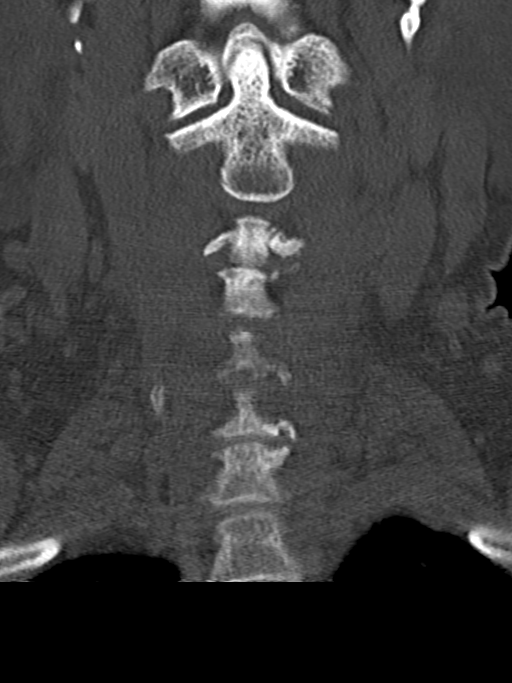
[im 19/48  bone]
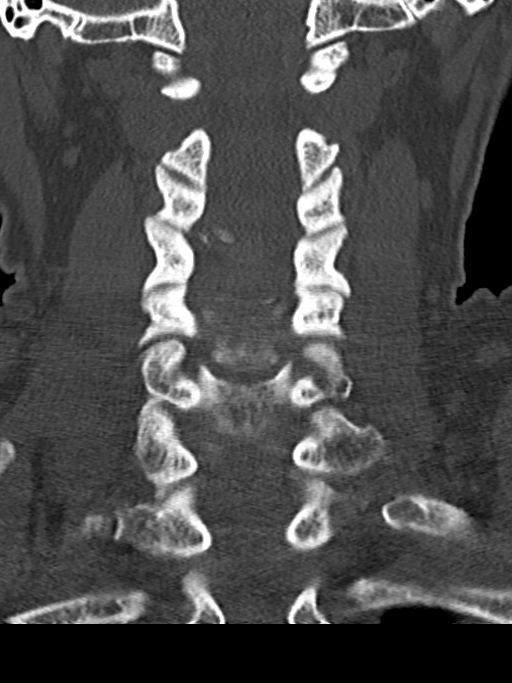
[im 29/48  bone]
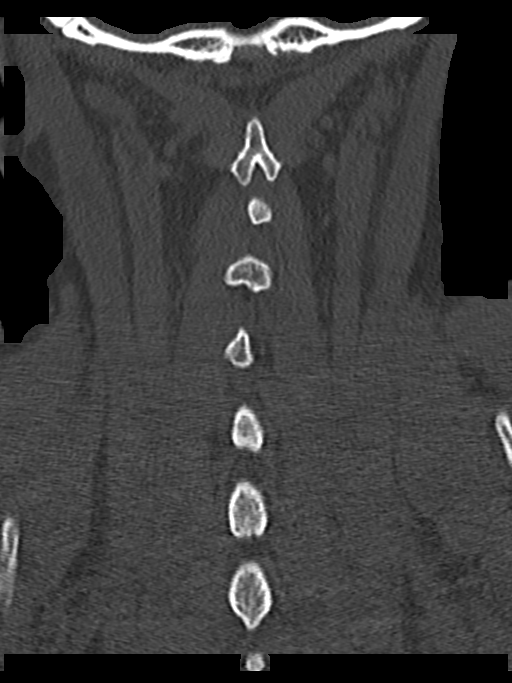

[Series 6: sagittals · sagittal · 0.28mm/px · 5 of 60 slices shown, 6 images]
[im 20/60  bone]
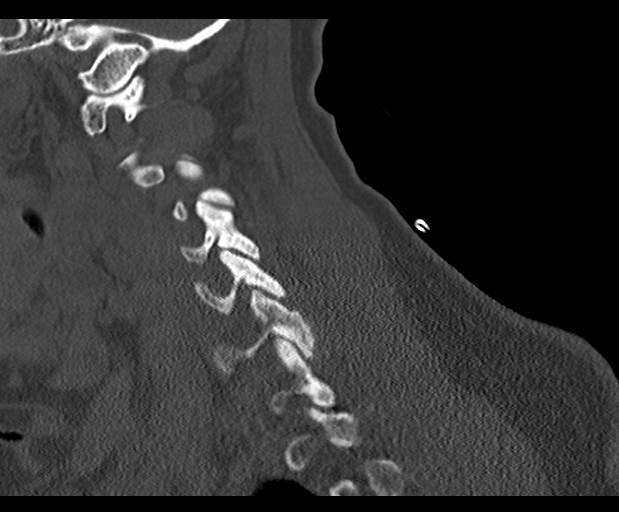
[im 25/60  bone]
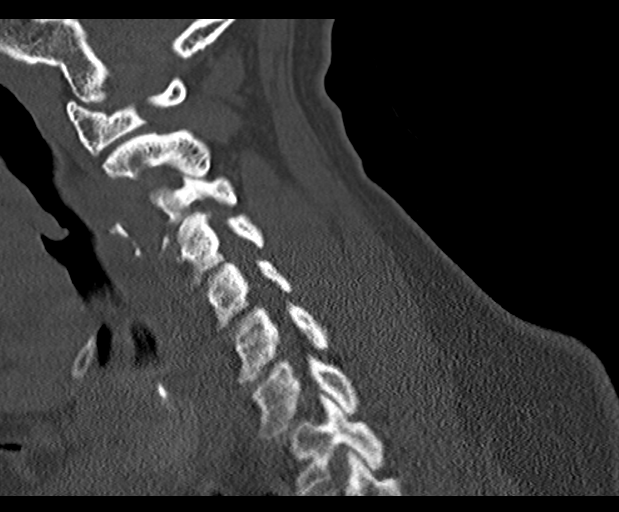
[im 30/60  soft-tissue]
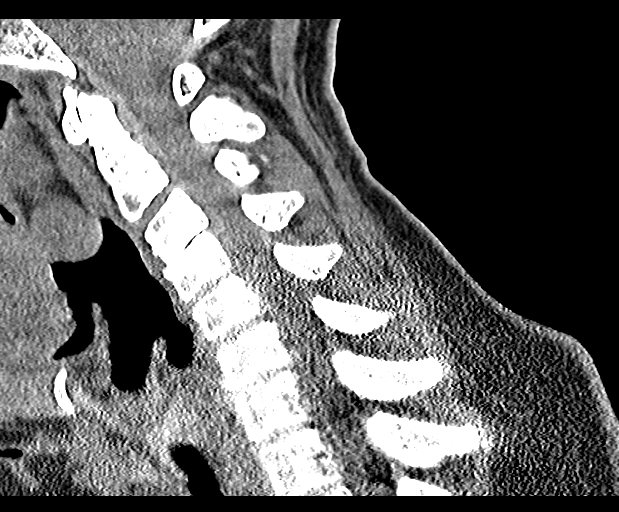
[im 30/60  bone]
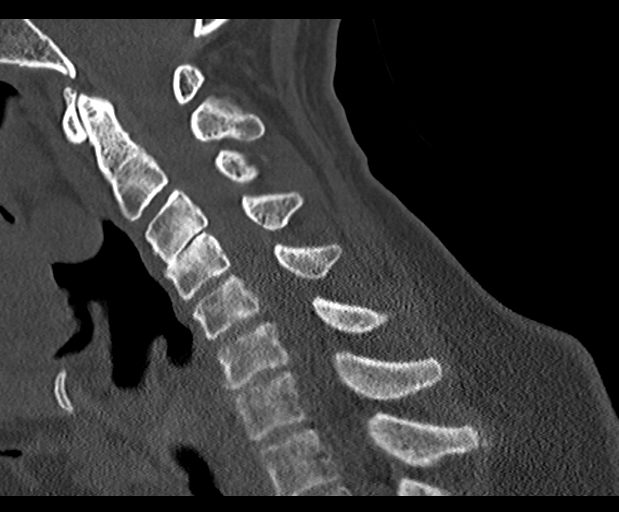
[im 35/60  bone]
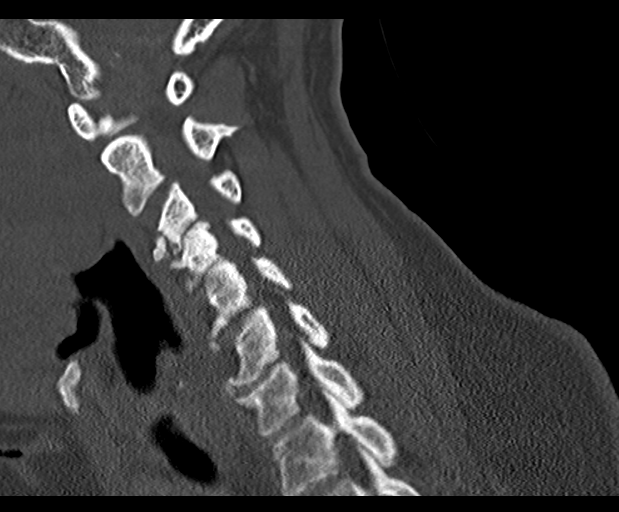
[im 40/60  bone]
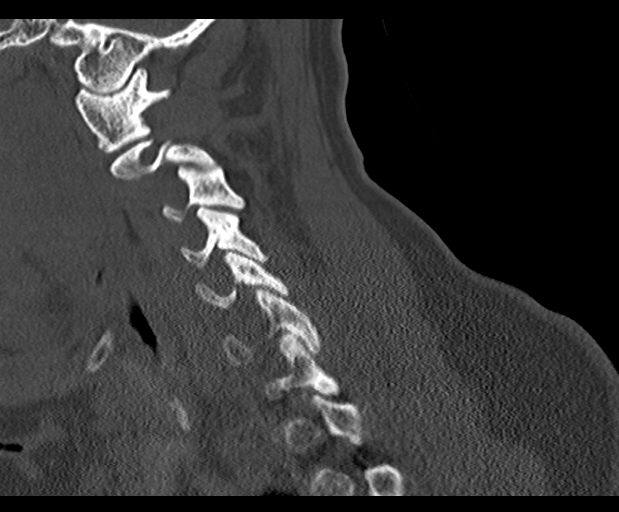

[13 of 33 positions shown; findings below may reference images not displayed]

FINDINGS: Alignment: Straightening of the cervical spine is likely positional.
There is mild retrolisthesis of C3 relative to C4, due to extensive
spondylosis.

Skull base and vertebrae: No acute fracture. No primary bone lesion
or focal pathologic process.

Soft tissues and spinal canal: There is a large mass partially
visualized in the submucosal region left side oropharynx, measuring
at least 4.2 cm in diameter. This mass is heterogeneous in
attenuation, with small coarse calcifications identified. Dedicated
CT neck with IV contrast is recommended for further evaluation. This
may be apparent by direct visual inspection.

Disc levels: Severe spondylosis at C3/C4, resulting in mild
symmetrical neural foraminal encroachment. Mild spondylosis at C6/C7
without compressive sequela.

Upper chest: There is narrowing of the oro pharyngeal airway due to
the left-sided mass described above. Subglottic airway and trachea
are patent. Lung apices are clear.

Other: Reconstructed images demonstrate no additional findings.
IMPRESSION: 1. Large submucosal mass left side oropharynx, for which CT neck
with IV contrast is recommended for further evaluation.
2. No acute cervical spine fracture.
3. Prominent spondylosis at C3/C4.
